# Patient Record
Sex: Male | Born: 1995 | Race: White | Hispanic: No | Marital: Married | Smoking: Current every day smoker
Health system: Southern US, Community
[De-identification: ages and names within clinical notes are randomized; demographics above are authoritative.]

## PROBLEM LIST (undated history)

## (undated) ENCOUNTER — Emergency Department (HOSPITAL_BASED_OUTPATIENT_CLINIC_OR_DEPARTMENT_OTHER): Admission: EM | Payer: Self-pay | Source: Home / Self Care

## (undated) DIAGNOSIS — T7840XA Allergy, unspecified, initial encounter: Secondary | ICD-10-CM

## (undated) DIAGNOSIS — K219 Gastro-esophageal reflux disease without esophagitis: Secondary | ICD-10-CM

## (undated) DIAGNOSIS — K30 Functional dyspepsia: Secondary | ICD-10-CM

## (undated) HISTORY — DX: Allergy, unspecified, initial encounter: T78.40XA

## (undated) HISTORY — DX: Functional dyspepsia: K30

## (undated) HISTORY — PX: NO PAST SURGERIES: SHX2092

---
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
## 2019-11-13 DIAGNOSIS — J029 Acute pharyngitis, unspecified: Secondary | ICD-10-CM | POA: Diagnosis not present

## 2021-08-21 DIAGNOSIS — R1013 Epigastric pain: Secondary | ICD-10-CM | POA: Diagnosis not present

## 2021-08-27 DIAGNOSIS — R1010 Upper abdominal pain, unspecified: Secondary | ICD-10-CM | POA: Diagnosis not present

## 2021-08-28 ENCOUNTER — Other Ambulatory Visit: Payer: Self-pay | Admitting: Family Medicine

## 2021-08-28 DIAGNOSIS — R1010 Upper abdominal pain, unspecified: Secondary | ICD-10-CM

## 2021-09-01 ENCOUNTER — Inpatient Hospital Stay: Admission: RE | Admit: 2021-09-01 | Payer: 59 | Source: Ambulatory Visit

## 2021-09-03 ENCOUNTER — Ambulatory Visit
Admission: RE | Admit: 2021-09-03 | Discharge: 2021-09-03 | Disposition: A | Discharge status: Home / Self Care | Payer: Self-pay | Source: Ambulatory Visit | Attending: Family Medicine | Admitting: Family Medicine

## 2021-09-03 ENCOUNTER — Other Ambulatory Visit: Payer: Self-pay

## 2021-09-03 DIAGNOSIS — R1010 Upper abdominal pain, unspecified: Secondary | ICD-10-CM | POA: Diagnosis not present

## 2021-09-28 ENCOUNTER — Other Ambulatory Visit (HOSPITAL_BASED_OUTPATIENT_CLINIC_OR_DEPARTMENT_OTHER): Payer: Self-pay

## 2021-09-28 ENCOUNTER — Emergency Department (HOSPITAL_BASED_OUTPATIENT_CLINIC_OR_DEPARTMENT_OTHER): Payer: BC Managed Care – PPO

## 2021-09-28 ENCOUNTER — Other Ambulatory Visit: Payer: Self-pay

## 2021-09-28 ENCOUNTER — Encounter (HOSPITAL_BASED_OUTPATIENT_CLINIC_OR_DEPARTMENT_OTHER): Payer: Self-pay | Admitting: *Deleted

## 2021-09-28 ENCOUNTER — Emergency Department (HOSPITAL_BASED_OUTPATIENT_CLINIC_OR_DEPARTMENT_OTHER)
Admission: EM | Admit: 2021-09-28 | Discharge: 2021-09-28 | Disposition: A | Discharge status: Home / Self Care | Payer: BC Managed Care – PPO | Attending: Emergency Medicine | Admitting: Emergency Medicine

## 2021-09-28 DIAGNOSIS — R7401 Elevation of levels of liver transaminase levels: Secondary | ICD-10-CM | POA: Insufficient documentation

## 2021-09-28 DIAGNOSIS — R109 Unspecified abdominal pain: Secondary | ICD-10-CM | POA: Diagnosis not present

## 2021-09-28 DIAGNOSIS — R110 Nausea: Secondary | ICD-10-CM | POA: Insufficient documentation

## 2021-09-28 DIAGNOSIS — R112 Nausea with vomiting, unspecified: Secondary | ICD-10-CM | POA: Diagnosis not present

## 2021-09-28 DIAGNOSIS — R1011 Right upper quadrant pain: Secondary | ICD-10-CM

## 2021-09-28 DIAGNOSIS — D72829 Elevated white blood cell count, unspecified: Secondary | ICD-10-CM | POA: Insufficient documentation

## 2021-09-28 DIAGNOSIS — R1013 Epigastric pain: Secondary | ICD-10-CM | POA: Diagnosis not present

## 2021-09-28 HISTORY — DX: Gastro-esophageal reflux disease without esophagitis: K21.9

## 2021-09-28 LAB — COMPREHENSIVE METABOLIC PANEL
ALT: 122 U/L — ABNORMAL HIGH (ref 0–44)
AST: 85 U/L — ABNORMAL HIGH (ref 15–41)
Albumin: 5 g/dL (ref 3.5–5.0)
Alkaline Phosphatase: 56 U/L (ref 38–126)
Anion gap: 15 (ref 5–15)
BUN: 16 mg/dL (ref 6–20)
CO2: 19 mmol/L — ABNORMAL LOW (ref 22–32)
Calcium: 9.7 mg/dL (ref 8.9–10.3)
Chloride: 103 mmol/L (ref 98–111)
Creatinine, Ser: 0.89 mg/dL (ref 0.61–1.24)
GFR, Estimated: >60 mL/min (ref >60)
Glucose, Bld: 115 mg/dL — ABNORMAL HIGH (ref 70–99)
Potassium: 3.8 mmol/L (ref 3.5–5.1)
Sodium: 137 mmol/L (ref 135–145)
Total Bilirubin: 1.6 mg/dL — ABNORMAL HIGH (ref 0.3–1.2)
Total Protein: 8.3 g/dL — ABNORMAL HIGH (ref 6.5–8.1)

## 2021-09-28 LAB — URINALYSIS, ROUTINE W REFLEX MICROSCOPIC
Bilirubin Urine: NEGATIVE
Glucose, UA: NEGATIVE mg/dL
Hgb urine dipstick: NEGATIVE
Ketones, ur: >=80 mg/dL — AB
Leukocytes,Ua: NEGATIVE
Nitrite: NEGATIVE
Protein, ur: NEGATIVE mg/dL
Specific Gravity, Urine: 1.025 (ref 1.005–1.030)
pH: 5.5 (ref 5.0–8.0)

## 2021-09-28 LAB — CBC
HCT: 52.8 % — ABNORMAL HIGH (ref 39.0–52.0)
Hemoglobin: 17.8 g/dL — ABNORMAL HIGH (ref 13.0–17.0)
MCH: 29.6 pg (ref 26.0–34.0)
MCHC: 33.7 g/dL (ref 30.0–36.0)
MCV: 87.9 fL (ref 80.0–100.0)
Platelets: 263 10*3/uL (ref 150–400)
RBC: 6.01 MIL/uL — ABNORMAL HIGH (ref 4.22–5.81)
RDW: 13.6 % (ref 11.5–15.5)
WBC: 16.6 10*3/uL — ABNORMAL HIGH (ref 4.0–10.5)
nRBC: 0 % (ref 0.0–0.2)

## 2021-09-28 LAB — LIPASE, BLOOD: Lipase: 35 U/L (ref 11–51)

## 2021-09-28 MED ORDER — TRAMADOL HCL 50 MG PO TABS
50.0000 mg | ORAL_TABLET | Freq: Three times a day (TID) | ORAL | 0 refills | Status: DC | PRN
  Filled 2021-09-28: qty 10, 4d supply, fill #0

## 2021-09-28 MED ORDER — FENTANYL CITRATE PF 50 MCG/ML IJ SOSY
50.0000 ug | PREFILLED_SYRINGE | Freq: Once | INTRAMUSCULAR | Status: AC
  Administered 2021-09-28: 50 ug via INTRAVENOUS
  Filled 2021-09-28: qty 1

## 2021-09-28 MED ORDER — LACTATED RINGERS IV BOLUS
1000.0000 mL | Freq: Once | INTRAVENOUS | Status: AC
  Administered 2021-09-28: 1000 mL via INTRAVENOUS

## 2021-09-28 MED ORDER — ESOMEPRAZOLE MAGNESIUM 40 MG PO CPDR
40.0000 mg | DELAYED_RELEASE_CAPSULE | Freq: Every day | ORAL | 0 refills | Status: DC
  Filled 2021-09-28: qty 30, 30d supply, fill #0

## 2021-09-28 MED ORDER — ONDANSETRON HCL 4 MG/2ML IJ SOLN
4.0000 mg | Freq: Once | INTRAMUSCULAR | Status: AC
  Administered 2021-09-28: 4 mg via INTRAVENOUS
  Filled 2021-09-28: qty 2

## 2021-09-28 NOTE — ED Notes (Signed)
States he has had at lead 12-16 loose stools since last PM. Having normal flatus ?

## 2021-09-28 NOTE — ED Notes (Signed)
Presents with epigastric type pain, states intermittent, can be very severe at times, often will need to vomit. States there seems to be no pattern to his pain, Abd is soft, bowel sounds x 4 easily noted, dullness on percussion. Non-distended at this time. Murphy sign negative and negative pain at RLQ during palpation. ?

## 2021-09-28 NOTE — ED Triage Notes (Signed)
Abdominal pain in January. He had a normal [place]. He was started on Nexium with relief. Last night he had diarrhea and abdominal pain. He took Valium with no relief. Hyperventilating at triage.  ?

## 2021-09-28 NOTE — Discharge Instructions (Addendum)
You have been seen and discharged from the emergency department.  An ambulatory referral to GI has been placed.  You require further gastroenterology work-up, possible endoscopy and further testing on the gallbladder.  They should contact you to set up follow-up, if you do not hear from them from next week please reach out to their office at the provided information.  I have increased her Nexium dose and sent a prescription.  I have also sent to prescription for pain medicine as needed.  Follow a bland diet till symptoms improve.  Follow-up with your primary provider for further evaluation and further care. Take home medications as prescribed. If you have any worsening symptoms or further concerns for your health please return to an emergency department for further evaluation. ?

## 2021-09-28 NOTE — ED Provider Notes (Signed)
Patient signed out to me by previous provider. Please refer to their note for full HPI.  Briefly this is a 26 year old male who presents emergency department epigastric pain.  Mild elevation in LFTs and stable slightly elevated bilirubin at 1.6.  Plan for ultrasound for evaluation, symptomatic treatment. ?Physical Exam  ?BP (!) 132/57   Pulse 79   Temp 97.7 ?F (36.5 ?C) (Oral)   Resp 17   Ht 5\' 11"  (1.803 m)   Wt 74.8 kg   SpO2 99%   BMI 23.01 kg/m?  ? ?Physical Exam ?Vitals and nursing note reviewed.  ?Constitutional:   ?   General: He is not in acute distress. ?   Appearance: Normal appearance.  ?HENT:  ?   Head: Normocephalic.  ?   Mouth/Throat:  ?   Mouth: Mucous membranes are moist.  ?Cardiovascular:  ?   Rate and Rhythm: Normal rate.  ?Pulmonary:  ?   Effort: Pulmonary effort is normal. No respiratory distress.  ?Abdominal:  ?   General: There is no distension.  ?   Palpations: Abdomen is soft.  ?   Tenderness: There is abdominal tenderness. There is no guarding or rebound.  ?Skin: ?   General: Skin is warm.  ?Neurological:  ?   Mental Status: He is alert and oriented to person, place, and time. Mental status is at baseline.  ?Psychiatric:     ?   Mood and Affect: Mood normal.  ? ? ?Procedures  ?Procedures ? ?ED Course / MDM  ?  ?Medical Decision Making ?Amount and/or Complexity of Data Reviewed ?Labs: ordered. ?Radiology: ordered. ? ?Risk ?Prescription drug management. ? ? ?Ultrasound shows no acute abnormalities.  Patient has been trying a low-dose Nexium for the past couple weeks.  Could be anything from gastritis to gallbladder dysfunction.  Plan for ambulatory GI referral, symptomatic treatment with strict return to ED precautions.  Abdominal exam for me is benign, vital signs are normal.  Patient at this time appears safe and stable for discharge and close outpatient follow up. Discharge plan and strict return to ED precautions discussed, patient verbalizes understanding and agreement. ? ? ? ? ?   ? , DO ?09/28/21 1626 ? ?

## 2021-09-28 NOTE — ED Provider Notes (Signed)
?[facility] EMERGENCY DEPARTMENT ?Provider Note ? ? ?CSN: [id number] ?Arrival date & time: 09/28/21  1233 ? ?  ? ?History ? ?No chief complaint on file. ? ? ?Jorge Hampton is a 26 y.o. male.  Presenting to the ER with concern for epigastric abdominal pain.  Patient has been having episodes over the past couple months.  Has discussed with primary care doctor, has had outpatient ultrasound which was reportedly normal and states that her primary doctor was working on referring to gastroenterology.  Has not seen specialist yet.  Most recent pain episode started last night, had loose stool, no blood.  Then had abdominal cramping, upper abdominal pain.  States that he tried taking Valium without any improvement.  Some associated nausea.  No fevers or chills.  Pain isolated to his upper abdomen.  No pain in lower abdomen. ? ?Completed chart review, ultrasound on 2/9 of right upper quadrant was completely normal. ?HPI ? ?  ? ?Home Medications ?Prior to Admission medications   ?Medication Sig Start Date End Date Taking? Authorizing Provider  ?diazepam (VALIUM) 2 MG tablet 1 tablet as needed 08/21/21  Yes [provider]  ?esomeprazole (NEXIUM) 40 MG capsule Take 1 capsule (40 mg total) by mouth daily. 09/28/21  Yes Horton, Clabe Seal, DO  ?traMADol (ULTRAM) 50 MG tablet Take 1 tablet (50 mg total) by mouth every 8 (eight) hours as needed. 09/28/21  Yes Horton, Clabe Seal, DO  ?albuterol (PROVENTIL HFA;VENTOLIN HFA) 108 (90 Base) MCG/ACT inhaler Inhale 2 puffs into the lungs every 4 (four) hours as needed for wheezing or shortness of breath. 12/13/15   Hayden Rasmussen, NP  ?ondansetron (ZOFRAN ODT) 8 MG disintegrating tablet Take 1 tablet (8 mg total) by mouth every 8 (eight) hours as needed for nausea or vomiting. 07/26/15   Molpus, John, MD  ?ondansetron (ZOFRAN) 4 MG tablet Take 1 tablet (4 mg total) by mouth every 6 (six) hours. Prn nausea or vomiting 12/13/15   Hayden Rasmussen, NP  ?predniSONE (DELTASONE) 20 MG tablet  Take 3 tabs po on first day, 2 tabs second day, 2 tabs third day, 1 tab fourth day, 1 tab 5th day. Take with food. 12/13/15   Hayden Rasmussen, NP  ?   ? ?Allergies    ?Patient has no known allergies.   ? ?Review of Systems   ?Review of Systems  ?Constitutional:  Negative for chills and fever.  ?HENT:  Negative for ear pain and sore throat.   ?Eyes:  Negative for pain and visual disturbance.  ?Respiratory:  Negative for cough and shortness of breath.   ?Cardiovascular:  Negative for chest pain and palpitations.  ?Gastrointestinal:  Positive for abdominal pain. Negative for vomiting.  ?Genitourinary:  Negative for dysuria and hematuria.  ?Musculoskeletal:  Negative for arthralgias and back pain.  ?Skin:  Negative for color change and rash.  ?Neurological:  Negative for seizures and syncope.  ?All other systems reviewed and are negative. ? ?Physical Exam ?Updated Vital Signs ?BP 136/75 (BP Location: Right Arm)   Pulse 91   Temp 98.1 ?F (36.7 ?C) (Oral)   Resp 17   Ht 5\' 11"  (1.803 m)   Wt 74.8 kg   SpO2 100%   BMI 23.01 kg/m?  ?Physical Exam ?Vitals and nursing note reviewed.  ?Constitutional:   ?   General: He is not in acute distress. ?   Appearance: He is well-developed.  ?HENT:  ?   Head: Normocephalic and atraumatic.  ?Eyes:  ?  Conjunctiva/sclera: Conjunctivae normal.  ?Cardiovascular:  ?   Rate and Rhythm: Normal rate and regular rhythm.  ?   Heart sounds: No murmur heard. ?Pulmonary:  ?   Effort: Pulmonary effort is normal. No respiratory distress.  ?   Breath sounds: Normal breath sounds.  ?Abdominal:  ?   Palpations: Abdomen is soft.  ?   Tenderness: There is no abdominal tenderness.  ?   Comments: There is some tenderness to the epigastrium and right upper quadrant, no lower abdominal tenderness noted  ?Musculoskeletal:     ?   General: No swelling.  ?   Cervical back: Neck supple.  ?Skin: ?   General: Skin is warm and dry.  ?   Capillary Refill: Capillary refill takes less than 2 seconds.  ?Neurological:   ?   Mental Status: He is alert.  ?Psychiatric:     ?   Mood and Affect: Mood normal.  ? ? ?ED Results / Procedures / Treatments   ?Labs ?(all labs ordered are listed, but only abnormal results are displayed) ?Labs Reviewed  ?COMPREHENSIVE METABOLIC PANEL - Abnormal; Notable for the following components:  ?    Result Value  ? CO2 19 (*)   ? Glucose, Bld 115 (*)   ? Total Protein 8.3 (*)   ? AST 85 (*)   ? ALT 122 (*)   ? Total Bilirubin 1.6 (*)   ? All other components within normal limits  ?CBC - Abnormal; Notable for the following components:  ? WBC 16.6 (*)   ? RBC 6.01 (*)   ? Hemoglobin 17.8 (*)   ? HCT 52.8 (*)   ? All other components within normal limits  ?URINALYSIS, ROUTINE W REFLEX MICROSCOPIC - Abnormal; Notable for the following components:  ? Ketones, ur >=80 (*)   ? All other components within normal limits  ?LIPASE, BLOOD  ? ? ?EKG ?None ? ?Radiology ?US Abdomen Limited RUQ (LIVER/GB) ? ?Result Date: 09/28/2021 ?CLINICAL DATA:  Mid abdominal pain, nausea, and vomiting. EXAM: ULTRASOUND ABDOMEN LIMITED RIGHT UPPER QUADRANT COMPARISON:  Abdominal ultrasound 09/03/2021 FINDINGS: Gallbladder: No gallstones or wall thickening visualized. No sonographic Murphy sign noted by sonographer. Common bile duct: Diameter: 3 mm Liver: No focal lesion identified. Within normal limits in parenchymal echogenicity. Portal vein is patent on color Doppler imaging with normal direction of blood flow towards the liver. Other: None. IMPRESSION: Negative right upper quadrant ultrasound. Electronically Signed   By: Sebastian Ache M.D.   On: 09/28/2021 15:33   ? ?Procedures ?Procedures  ? ? ?Medications Ordered in ED ?Medications  ?fentaNYL (SUBLIMAZE) injection 50 mcg (50 mcg Intravenous Given 09/28/21 1353)  ?ondansetron (ZOFRAN) injection 4 mg (4 mg Intravenous Given 09/28/21 1353)  ?lactated ringers bolus 1,000 mL (0 mLs Intravenous Stopped 09/28/21 1452)  ? ? ?ED Course/ Medical Decision Making/ A&P ?  ?                         ?Medical Decision Making ?Amount and/or Complexity of Data Reviewed ?Labs: ordered. ?Radiology: ordered. ? ?Risk ?Prescription drug management. ? ? ?26 year old male presenting to the ER with concern for abdominal discomfort.  On physical exam he is well-appearing in no distress, noted some mild tenderness to the epigastrium and right upper quadrant but no rebound or guarding and no tenderness in his lower quadrants.  Initial laboratory work-up obtained and noted slight elevation in AST and ALT as well as leukocytosis.  Per review of chart,  patient had similar level of leukocytosis 5 years ago.  Given the patient occasion of pain, we will plan to repeat ultrasound in ER to evaluate for acute biliary pathology.  While awaiting ultrasound and reassessment, signed out to Dr. Wilkie Aye.  She will follow-up on results of ultrasound and reassess patient.  If the ultrasound is reassuring and patient's symptoms are well controlled, then anticipate he will be able to follow-up with his primary care doctor and continue work-up in an outpatient setting for these intermittent episodes that he has been having over the past couple months. ? ?Mother at bedside who provided some additional history and was updated throughout patient's stay. ? ?Additional history obtained from review of chart, review of past ultrasound results, past laboratory values. ? ? ? ? ? ? ? ?Final Clinical Impression(s) / ED Diagnoses ?Final diagnoses:  ?RUQ pain  ? ? ?Rx / DC Orders ?ED Discharge Orders   ? ?      Ordered  ?  esomeprazole (NEXIUM) 40 MG capsule  Daily       ? 09/28/21 1623  ?  traMADol (ULTRAM) 50 MG tablet  Every 8 hours PRN       ? 09/28/21 1623  ?  Ambulatory referral to Gastroenterology       ? 09/28/21 1623  ? ?  ?  ? ?  ? ? ?  ?Milagros Loll, MD ?09/29/21 [phone] ? ?

## 2021-09-29 ENCOUNTER — Encounter: Payer: Self-pay | Admitting: Gastroenterology

## 2021-10-13 ENCOUNTER — Encounter: Payer: Self-pay | Admitting: Gastroenterology

## 2021-10-13 ENCOUNTER — Other Ambulatory Visit (INDEPENDENT_AMBULATORY_CARE_PROVIDER_SITE_OTHER): Payer: BC Managed Care – PPO

## 2021-10-13 ENCOUNTER — Other Ambulatory Visit: Payer: Self-pay

## 2021-10-13 ENCOUNTER — Ambulatory Visit (INDEPENDENT_AMBULATORY_CARE_PROVIDER_SITE_OTHER): Payer: BC Managed Care – PPO | Admitting: Gastroenterology

## 2021-10-13 VITALS — BP 134/62 | HR 58 | Ht 72.0 in | Wt 165.1 lb

## 2021-10-13 DIAGNOSIS — D72829 Elevated white blood cell count, unspecified: Secondary | ICD-10-CM

## 2021-10-13 DIAGNOSIS — R748 Abnormal levels of other serum enzymes: Secondary | ICD-10-CM

## 2021-10-13 DIAGNOSIS — R1013 Epigastric pain: Secondary | ICD-10-CM

## 2021-10-13 DIAGNOSIS — R17 Unspecified jaundice: Secondary | ICD-10-CM | POA: Diagnosis not present

## 2021-10-13 DIAGNOSIS — R1010 Upper abdominal pain, unspecified: Secondary | ICD-10-CM

## 2021-10-13 LAB — CBC WITH DIFFERENTIAL/PLATELET
Basophils Absolute: 0.1 10*3/uL (ref 0.0–0.1)
Basophils Relative: 0.8 % (ref 0.0–3.0)
Eosinophils Absolute: 0.1 10*3/uL (ref 0.0–0.7)
Eosinophils Relative: 1.1 % (ref 0.0–5.0)
HCT: 44.7 % (ref 39.0–52.0)
Hemoglobin: 14.8 g/dL (ref 13.0–17.0)
Lymphocytes Relative: 23.1 % (ref 12.0–46.0)
Lymphs Abs: 1.5 10*3/uL (ref 0.7–4.0)
MCHC: 33.2 g/dL (ref 30.0–36.0)
MCV: 89 fl (ref 78.0–100.0)
Monocytes Absolute: 0.5 10*3/uL (ref 0.1–1.0)
Monocytes Relative: 7.8 % (ref 3.0–12.0)
Neutro Abs: 4.3 10*3/uL (ref 1.4–7.7)
Neutrophils Relative %: 67.2 % (ref 43.0–77.0)
Platelets: 207 10*3/uL (ref 150.0–400.0)
RBC: 5.02 Mil/uL (ref 4.22–5.81)
RDW: 13.8 % (ref 11.5–15.5)
WBC: 6.4 10*3/uL (ref 4.0–10.5)

## 2021-10-13 LAB — HEPATIC FUNCTION PANEL
ALT: 30 U/L (ref 0–53)
AST: 19 U/L (ref 0–37)
Albumin: 4.8 g/dL (ref 3.5–5.2)
Alkaline Phosphatase: 52 U/L (ref 39–117)
Bilirubin, Direct: 0.1 mg/dL (ref 0.0–0.3)
Total Bilirubin: 0.6 mg/dL (ref 0.2–1.2)
Total Protein: 7 g/dL (ref 6.0–8.3)

## 2021-10-13 NOTE — Progress Notes (Signed)
? ? ?          ?Chief Complaint: Abdominal pain, elevated liver enzymes, ER follow-up ? ? ?Referring Provider:     Lorelle Gibbs, DO ? ? ?HPI:   ? ? 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 ? ?He reports having intermittent upper abdominal pain for the last 6 years or so. Sxs occur in flares a few times/year. The most recent in Jan and earlier this month were the worst episodes to date. Starts as generalized, cramping abdominal pain, then becomes sharp, "15 out of 10" pain in MEG. No radiation. Throbbing type pain.  ? ?-07/26/2015: CT A/P: Apparent thickening of jejunal folds concerning for enteritis.  Otherwise normal ?- 12/14/2015: WBC 16.6, otherwise normal CBC.  T. bili 1.6, otherwise normal CMP ?- 07/2021: Started taking OTC Nexium 20 mg/day ?- 09/03/2021: Abdominal ultrasound: Normal ? ?-09/28/2021: ER evaluation for epigastric pain.  HD stable.  Exam with some TTP in MEG and RUQ. ?- WBC 16.6, H/H17.8/52.8, PLT 263 ?- AST/ALT 85/122, T. bili 1.6, ALP 56 ?- CO2 19, otherwise normal BMP.  Normal lipase ?- RUQ [place]: Normal ?- Discharged with Nexium 40 mg/day ? ?He states he has been told his liver enzymes have been elevated in the past by his firehouse physician. No hx of jaundice , icteric sclera, ascites.  ? ?No known family history of CRC, GI malignancy, liver disease, pancreatic disease, or IBD.  ? ?No prior EGD or colonoscopy.  ? ? ?Past Medical History:  ?Diagnosis Date  ? GERD (gastroesophageal reflux disease)   ? ? ? ?History reviewed. No pertinent surgical history. ?Family History  ?Problem Relation Age of Onset  ? Colon cancer Neg Hx   ? Esophageal cancer Neg Hx   ? ?Social History  ? ?Tobacco Use  ? Smoking status: Some Days  ?  Types: Cigarettes  ? Smokeless tobacco: Current  ?  Types: Snuff, Chew  ? Tobacco comments:  ?  Socially smoke  ?Vaping Use  ? Vaping Use: Some days  ?Substance Use Topics  ? Alcohol use: Yes  ?  Comment:  ocassionally  ? Drug use: No  ? ?Current Outpatient Medications  ?Medication Sig Dispense Refill  ? esomeprazole (NEXIUM) 40 MG capsule Take 1 capsule (40 mg total) by mouth daily. 30 capsule 0  ? loratadine (CLARITIN) 10 MG tablet Take 10 mg by mouth daily as needed for allergies.    ? traMADol (ULTRAM) 50 MG tablet Take 1 tablet (50 mg total) by mouth every 8 (eight) hours as needed. 10 tablet 0  ? albuterol (PROVENTIL HFA;VENTOLIN HFA) 108 (90 Base) MCG/ACT inhaler Inhale 2 puffs into the lungs every 4 (four) hours as needed for wheezing or shortness of breath. (Patient not taking: Reported on 10/13/2021) 1 Inhaler 0  ? diazepam (VALIUM) 2 MG tablet 1 tablet as needed (Patient not taking: Reported on 10/13/2021)    ? ?No current facility-administered medications for this visit.  ? ?No Known Allergies ? ? ?Review of Systems: ?All systems reviewed and negative except where noted in HPI.  ? ? ? ?Physical Exam:   ? ?Wt Readings from Last 3 Encounters:  ?10/13/21 165 lb 2 oz (74.9 kg)  ?09/28/21 165 lb (74.8 kg)  ?12/14/15 150 lb (68 kg) (50 %, Z= 0.00)*  ? ?* Growth percentiles are based on CDC (Boys, 2-20 Years) data.  ? ? ?BP 134/62   Pulse (!) 58  Ht 6' (1.829 m)   Wt 165 lb 2 oz (74.9 kg)   BMI 22.39 kg/m?  ?Constitutional:  Pleasant, in no acute distress. ?Psychiatric: Normal mood and affect. Behavior is normal. ?Cardiovascular: Normal rate, regular rhythm. No edema ?Pulmonary/chest: Effort normal and breath sounds normal. No wheezing, rales or rhonchi. ?Abdominal: Soft, nondistended, nontender. Bowel sounds active throughout. There are no masses palpable. No hepatomegaly. ?Neurological: Alert and oriented to person place and time. ?Skin: Skin is warm and dry. No rashes noted. ? ? ?ASSESSMENT AND PLAN;  ? ?1) Upper abdominal pain/Epigastric pain ?- EGD to evaluate for mucosal/luminal pathology ?- Okay to resume OTC Nexium for now ?- HIDA scan to evaluate for biliary dyskinesia ?- If above work-up unrevealing,  plan for CT (vs CTA) ? ?2) Elevated AST/ALT ?3) Elevated T. bili ?- Repeat liver enzymes with T. bili fractionization ?- HIDA as above ?- Recent RUQ [place] otherwise normal ? ?4) Leukocytosis ?- Repeat CBC.  If WBC still elevated, plan for Hematology referral ? ?The indications, risks, and benefits of EGD were explained to the patient in detail. Risks include but are not limited to bleeding, perforation, adverse reaction to medications, and cardiopulmonary compromise. Sequelae include but are not limited to the possibility of surgery, hospitalization, and mortality. The patient verbalized understanding and wished to proceed. All questions answered, referred to scheduler. Further recommendations pending results of the exam.  ? ? ?Lavena Bullion, DO, FACG  10/13/2021, 9:21 AM ? ? ?Hampton, Jorge Critchley, DO ? ?

## 2021-10-13 NOTE — Patient Instructions (Addendum)
If you are age 25 or younger, your body mass index should be between 19-25. Your Body mass index is 22.39 kg/m?Marland Kitchen If this is out of the aformentioned range listed, please consider follow up with your Primary Care Provider.  ? ?__________________________________________________________ ? ?The [facility] providers would like to encourage you to use [facility] to communicate with providers for non-urgent requests or questions.  Due to long hold times on the telephone, sending your provider a message by [facility] may be a faster and more efficient way to get a response.  Please allow 48 business hours for a response.  Please remember that this is for non-urgent requests.   ? ?Due to recent changes in healthcare laws, you may see the results of your imaging and laboratory studies on MyChart before your provider has had a chance to review them.  We understand that in some cases there may be results that are confusing or concerning to you. Not all laboratory results come back in the same time frame and the provider may be waiting for multiple results in order to interpret others.  Please give [place] 48 hours in order for your provider to thoroughly review all the results before contacting the office for clarification of your results.  ? ?Please go to the lab on the 2nd floor [address] before you leave the office today.  ? ?You have been scheduled for an endoscopy. Please follow written instructions given to you at your visit today. ?If you use inhalers (even only as needed), please bring them with you on the day of your procedure.  ? ?You have been scheduled for a HIDA scan at [facility] (1st floor) on 10/30/21 Please arrive 15 minutes prior to your scheduled appointment at  0.23XI. Make certain not to have anything to eat or drink at least 6 hours prior to your test. Should this appointment date or time not work well for you, please call radiology scheduling at [phone].   ?_____________________________________________________________________ ?hepatobiliary (HIDA) scan is an imaging procedure used to diagnose problems in the liver, gallbladder and bile ducts. In the HIDA scan, a radioactive chemical or tracer is injected into a vein in your arm. The tracer is handled by the liver like bile. Bile is a fluid produced and excreted by your liver that helps your digestive system break down fats in the foods you eat. Bile is stored in your gallbladder and the gallbladder releases the bile when you eat a meal. A special nuclear medicine scanner (gamma camera) tracks the flow of the tracer from your liver into your gallbladder and small intestine.  ?During your HIDA scan  ?You'll be asked to change into a hospital gown before your HIDA scan begins. Your health care team will position you on a table, usually on your back. The radioactive tracer is then injected into a vein in your arm.The tracer travels through your bloodstream to your liver, where it's taken up by the bile-producing cells. The radioactive tracer travels with the bile from your liver into your gallbladder and through your bile ducts to your small intestine.You may feel some pressure while the radioactive tracer is injected into your vein. As you lie on the table, a special gamma camera is positioned over your abdomen taking pictures of the tracer as it moves through your body. The gamma camera takes pictures continually for about an hour. You'll need to keep still during the HIDA scan. This can become uncomfortable, but you may find that you can lessen the  discomfort by taking deep breaths and thinking about other things. Tell your health care team if you're uncomfortable. The radiologist will watch on a computer the progress of the radioactive tracer through your body. The HIDA scan may be stopped when the radioactive tracer is seen in the gallbladder and enters your small intestine. This typically takes about an hour. In  some cases extra imaging will be performed if original images aren't satisfactory, if morphine is given to help visualize the gallbladder or if the medication CCK is given to look at the contraction of the gallbladder. This test typically takes 2 hours to complete. ?________________________________________________________________________   ? ? ? ? ?Thank you for choosing me and [facility]. ? ?Doristine Locks, D.O. ? ? ? ? ?We want to thank you for trusting [facility] with your care. All of our staff and providers value the relationships we have built with our patients, and it is an honor to care for you.  ? ?We are writing to let you know that [facility] will close on Dec 07, 2021, and we invite you to continue to see Dr. Edman Circle and Doristine Locks at the [facility] [place] office location. We are consolidating our serices at these [facility] practices to better provide care. Our office staff will work with you to ensure a seamless transition.  ? ?Doristine Locks, DO -Dr. Barron Alvine will be movig to [facility] at [address] [place], [place] [address], effective Dec 07, 2021.  Contact [phone] to schedule an appointment with him.  ? ?Edman Circle, MD- Dr. Chales Abrahams will be movig to [facility] at [address] [place], [place] [address], effective Dec 07, 2021.  Contact [phone] to schedule an appointment with him.  ? ?Requesting Medical Records ?If you need to request your medical records, please follow the instructions below. Your medical records are confidential, and a copy can be transferred to another provider or released to you or another person you designate only with your permission. ? ?There are several ways to request your medical records: ?Requests for medical records can be submitted through our practice.   ?You can also request your records electronically, in your MyChart account by selecting  the ?Request Health Records? tab.  ?If you need additional information on how to request records, please go to [email or web address], choose Patient Information, then select Request Medical Records. ?To make an appointment or if you have any questions about your health care needs, please contact our office at [phone] and one of our staff members will be glad to assist you. ?[facility] is committed to providing exceptional care for you and our community. Thank you for allowing [place] to serve your health care needs. ?Sincerely, ? ?Trixie Dredge, Director  Gastroenterology ?[facility] also offers convenient virtual care options. Sore throat? Sinus problems? Cold or flu symptoms? Get care from the comfort of home with [facility] Video Visits and e-Visits. Learn more about the non-emergency conditions treated and start your virtual visit at [email or web address] ? ?

## 2021-10-14 ENCOUNTER — Telehealth: Payer: Self-pay | Admitting: General Surgery

## 2021-10-14 NOTE — Telephone Encounter (Signed)
Notified the patient his labs have returned to normal and Dr Bryan Lemma would like him to proceed with the EGD and HIDA scan. The patient verbalized understanding ?

## 2021-10-14 NOTE — Telephone Encounter (Signed)
-----   Message from Shellia Cleverly, DO sent at 10/14/2021  7:56 AM EDT ----- ?Good news- the CBC is normal, including normalization of the elevated WBC. The liver enzymes have also normalized. Plan to proceed with EGD and HIDA as planned.  ?

## 2021-10-14 NOTE — Telephone Encounter (Signed)
-----   Message from Vito Cirigliano V, DO sent at 10/14/2021  7:56 AM EDT ----- ?Good news- the CBC is normal, including normalization of the elevated WBC. The liver enzymes have also normalized. Plan to proceed with EGD and HIDA as planned.  ?

## 2021-10-23 ENCOUNTER — Encounter: Payer: Self-pay | Admitting: Gastroenterology

## 2021-10-23 ENCOUNTER — Ambulatory Visit (AMBULATORY_SURGERY_CENTER): Payer: BC Managed Care – PPO | Admitting: Gastroenterology

## 2021-10-23 VITALS — BP 96/55 | HR 66 | Temp 97.8°F | Resp 12 | Ht 72.0 in | Wt 165.0 lb

## 2021-10-23 DIAGNOSIS — K219 Gastro-esophageal reflux disease without esophagitis: Secondary | ICD-10-CM | POA: Diagnosis not present

## 2021-10-23 DIAGNOSIS — K2970 Gastritis, unspecified, without bleeding: Secondary | ICD-10-CM | POA: Diagnosis not present

## 2021-10-23 DIAGNOSIS — R1013 Epigastric pain: Secondary | ICD-10-CM

## 2021-10-23 DIAGNOSIS — R1010 Upper abdominal pain, unspecified: Secondary | ICD-10-CM | POA: Diagnosis not present

## 2021-10-23 MED ORDER — SODIUM CHLORIDE 0.9 % IV SOLN: 500.0000 mL | Freq: Once | INTRAVENOUS | Status: DC

## 2021-10-23 NOTE — Progress Notes (Signed)
Called to room to assist during endoscopic procedure.  Patient ID and intended procedure confirmed with present staff. Received instructions for my participation in the procedure from the performing physician.  

## 2021-10-23 NOTE — Op Note (Signed)
[place] ?Patient Name: Jorge Hampton ?Procedure Date: 10/23/2021 10:17 AM ?MRN: [id number] ?Endoscopist: Gerrit Heck , MD ?Age: 26 ?Referring MD:  ?Date of Birth: 05-May-1996 ?Gender: Male ?Account #: [id number] ?Procedure:                Upper GI endoscopy ?Indications:              Epigastric abdominal pain, Upper abdominal pain ?Medicines:                Monitored Anesthesia Care ?Procedure:                Pre-Anesthesia Assessment: ?                          - Prior to the procedure, a History and Physical  ?                          was performed, and patient medications and  ?                          allergies were reviewed. The patient's tolerance of  ?                          previous anesthesia was also reviewed. The risks  ?                          and benefits of the procedure and the sedation  ?                          options and risks were discussed with the patient.  ?                          All questions were answered, and informed consent  ?                          was obtained. Prior Anticoagulants: The patient has  ?                          taken no previous anticoagulant or antiplatelet  ?                          agents. ASA Grade Assessment: II - A patient with  ?                          mild systemic disease. After reviewing the risks  ?                          and benefits, the patient was deemed in  ?                          satisfactory condition to undergo the procedure. ?                          After obtaining informed consent, the endoscope was  ?  passed under direct vision. Throughout the  ?                          procedure, the patient's blood pressure, pulse, and  ?                          oxygen saturations were monitored continuously. The  ?                          GIF [id number] #[id number] was introduced through the  ?                          mouth, and advanced to the second part of duodenum.  ?                          The  upper GI endoscopy was accomplished without  ?                          difficulty. The patient tolerated the procedure  ?                          well. ?Scope In: ?Scope Out: ?Findings:                 The examined esophagus was normal. ?                          The Z-line was regular and was found 44 cm from the  ?                          incisors. ?                          Scattered mild inflammation characterized by  ?                          erythema was found in the gastric fundus and in the  ?                          gastric body. Biopsies were taken with a cold  ?                          forceps for histology. Estimated blood loss was  ?                          minimal. ?                          The incisura, gastric antrum and pylorus were  ?                          normal. ?                          The examined duodenum was normal. ?Complications:            No  immediate complications. ?Estimated Blood Loss:     Estimated blood loss was minimal. ?Impression:               - Normal esophagus. ?                          - Z-line regular, 44 cm from the incisors. ?                          - Gastritis. Biopsied. ?                          - Normal incisura, antrum and pylorus. ?                          - Normal examined duodenum. ?Recommendation:           - Patient has a contact number available for  ?                          emergencies. The signs and symptoms of potential  ?                          delayed complications were discussed with the  ?                          patient. Return to normal activities tomorrow.  ?                          Written discharge instructions were provided to the  ?                          patient. ?                          - Resume previous diet. ?                          - Continue present medications. ?                          - Await pathology results. ?Gerrit Heck, MD ?10/23/2021 10:45:33 AM ?

## 2021-10-23 NOTE — Patient Instructions (Signed)
YOU HAD AN ENDOSCOPIC PROCEDURE TODAY AT THE [facility]:   Refer to the procedure report that was given to you for any specific questions about what was found during the examination.  If the procedure report does not answer your questions, please call your gastroenterologist to clarify.  If you requested that your care partner not be given the details of your procedure findings, then the procedure report has been included in a sealed envelope for you to review at your convenience later.  YOU SHOULD EXPECT: Some feelings of bloating in the abdomen. Passage of more gas than usual.  Walking can help get rid of the air that was put into your GI tract during the procedure and reduce the bloating. If you had a lower endoscopy (such as a colonoscopy or flexible sigmoidoscopy) you may notice spotting of blood in your stool or on the toilet paper. If you underwent a bowel prep for your procedure, you may not have a normal bowel movement for a few days.  Please Note:  You might notice some irritation and congestion in your nose or some drainage.  This is from the oxygen used during your procedure.  There is no need for concern and it should clear up in a day or so.  SYMPTOMS TO REPORT IMMEDIATELY:    Following upper endoscopy (EGD)  Vomiting of blood or coffee ground material  New chest pain or pain under the shoulder blades  Painful or persistently difficult swallowing  New shortness of breath  Fever of 100F or higher  Black, tarry-looking stools  For urgent or emergent issues, a gastroenterologist can be reached at any hour by calling [phone]. Do not use MyChart messaging for urgent concerns.    DIET:  We do recommend a small meal at first, but then you may proceed to your regular diet.  Drink plenty of fluids but you should avoid alcoholic beverages for 24 hours.  ACTIVITY:  You should plan to take it easy for the rest of today and you should NOT DRIVE or use heavy machinery  until tomorrow (because of the sedation medicines used during the test).    FOLLOW UP: Our staff will call the number listed on your records 48-72 hours following your procedure to check on you and address any questions or concerns that you may have regarding the information given to you following your procedure. If we do not reach you, we will leave a message.  We will attempt to reach you two times.  During this call, we will ask if you have developed any symptoms of COVID 19. If you develop any symptoms (ie: fever, flu-like symptoms, shortness of breath, cough etc.) before then, please call [phone].  If you test positive for Covid 19 in the 2 weeks post procedure, please call and report this information to us.    If any biopsies were taken you will be contacted by phone or by letter within the next 1-3 weeks.  Please call us at [phone] if you have not heard about the biopsies in 3 weeks.    SIGNATURES/CONFIDENTIALITY: You and/or your care partner have signed paperwork which will be entered into your electronic medical record.  These signatures attest to the fact that that the information above on your After Visit Summary has been reviewed and is understood.  Full responsibility of the confidentiality of this discharge information lies with you and/or your care-partner. 

## 2021-10-23 NOTE — Progress Notes (Signed)
? ?GASTROENTEROLOGY PROCEDURE H&P NOTE  ? ?Primary Care Physician: ?Johny Blamer, MD ? ? ? ?Reason for Procedure:   Epigastric pain/upper abdominal pain, leukocytosis ? ?Plan:    EGD ? ?Patient is appropriate for endoscopic procedure(s) in the ambulatory ([facility]) setting. ? ?The nature of the procedure, as well as the risks, benefits, and alternatives were carefully and thoroughly reviewed with the patient. Ample time for discussion and questions allowed. The patient understood, was satisfied, and agreed to proceed.  ? ? ? ?HPI: ?Jorge Hampton is a 26 y.o. male who presents for EGD for evaluation of recurrent upper abdominal/epigastric pain.  ? ?Past Medical History:  ?Diagnosis Date  ? Allergy   ? GERD (gastroesophageal reflux disease)   ? ? ?History reviewed. No pertinent surgical history. ? ?Prior to Admission medications   ?Medication Sig Start Date End Date Taking? Authorizing Provider  ?esomeprazole (NEXIUM) 40 MG capsule Take 1 capsule (40 mg total) by mouth daily. 09/28/21  Yes Horton, Clabe Seal, DO  ?loratadine (CLARITIN) 10 MG tablet Take 10 mg by mouth daily as needed for allergies.   Yes [provider]  ?acetaminophen (TYLENOL) 500 MG tablet 1 tablet as needed ?Patient not taking: Reported on 10/23/2021    [provider]  ?diazepam (VALIUM) 2 MG tablet 1 tablet as needed ?Patient not taking: Reported on 10/13/2021 08/21/21   [provider]  ?dicyclomine (BENTYL) 20 MG tablet 1 tablet ?Patient not taking: Reported on 10/23/2021 08/21/21   [provider]  ?traMADol (ULTRAM) 50 MG tablet Take 1 tablet (50 mg total) by mouth every 8 (eight) hours as needed. ?Patient not taking: Reported on 10/23/2021 09/28/21   Horton, Clabe Seal, DO  ? ? ?Current Outpatient Medications  ?Medication Sig Dispense Refill  ? esomeprazole (NEXIUM) 40 MG capsule Take 1 capsule (40 mg total) by mouth daily. 30 capsule 0  ? loratadine (CLARITIN) 10 MG tablet Take 10 mg by mouth daily as needed for  allergies.    ? acetaminophen (TYLENOL) 500 MG tablet 1 tablet as needed (Patient not taking: Reported on 10/23/2021)    ? diazepam (VALIUM) 2 MG tablet 1 tablet as needed (Patient not taking: Reported on 10/13/2021)    ? dicyclomine (BENTYL) 20 MG tablet 1 tablet (Patient not taking: Reported on 10/23/2021)    ? traMADol (ULTRAM) 50 MG tablet Take 1 tablet (50 mg total) by mouth every 8 (eight) hours as needed. (Patient not taking: Reported on 10/23/2021) 10 tablet 0  ? ?Current Facility-Administered Medications  ?Medication Dose Route Frequency Provider Last Rate Last Admin  ? 0.9 %  sodium chloride infusion  500 mL Intravenous Once Maxima Skelton V, DO      ? ? ?Allergies as of 10/23/2021  ? (No Known Allergies)  ? ? ?Family History  ?Problem Relation Age of Onset  ? Colon cancer Neg Hx   ? Esophageal cancer Neg Hx   ? Rectal cancer Neg Hx   ? Stomach cancer Neg Hx   ? ? ?Social History  ? ?Socioeconomic History  ? Marital status: Single  ?  Spouse name: Not on file  ? Number of children: Not on file  ? Years of education: Not on file  ? Highest education level: Not on file  ?Occupational History  ? Not on file  ?Tobacco Use  ? Smoking status: Some Days  ?  Types: Cigarettes  ? Smokeless tobacco: Current  ?  Types: Snuff, Chew  ? Tobacco comments:  ?  Socially  smoke  ?Vaping Use  ? Vaping Use: Some days  ?Substance and Sexual Activity  ? Alcohol use: Yes  ?  Comment: ocassionally  ? Drug use: No  ? Sexual activity: Not on file  ?Other Topics Concern  ? Not on file  ?Social History Narrative  ? Not on file  ? ?Social Determinants of Health  ? ?Financial Resource Strain: Not on file  ?Food Insecurity: Not on file  ?Transportation Needs: Not on file  ?Physical Activity: Not on file  ?Stress: Not on file  ?Social Connections: Not on file  ?Intimate Partner Violence: Not on file  ? ? ?Physical Exam: ?Vital signs in last 24 hours: ?@BP  122/73   Pulse (!) 52   Temp 97.8 ?F (36.6 ?C)   Resp (!) 9   Ht 6' (1.829 m)    Wt 165 lb (74.8 kg)   SpO2 100%   BMI 22.38 kg/m?  ?GEN: NAD ?EYE: Sclerae anicteric ?ENT: MMM ?CV: Non-tachycardic ?Pulm: CTA b/l ?GI: Soft, NT/ND ?NEURO:  Alert & Oriented x 3 ? ? ? , DO ?[facility] ? ? ?10/23/2021 10:27 AM ? ?

## 2021-10-23 NOTE — Progress Notes (Signed)
Report to PACU, RN, vss, BBS= Clear.  

## 2021-10-27 ENCOUNTER — Telehealth: Payer: Self-pay

## 2021-10-27 NOTE — Telephone Encounter (Signed)
?  Follow up Call- ? ? ?  10/23/2021  ?  9:49 AM  ?Call back number  ?Post procedure Call Back phone  # [phone]  ?Permission to leave phone message Yes  ?  ? ?Patient questions: ? ?Do you have a fever, pain , or abdominal swelling? No. ?Pain Score  0 * ? ?Have you tolerated food without any problems? Yes.   ? ?Have you been able to return to your normal activities? Yes.   ? ?Do you have any questions about your discharge instructions: ?Diet   No. ?Medications  No. ?Follow up visit  No. ? ?Do you have questions or concerns about your Care? No. ? ?Actions: ?* If pain score is 4 or above: ?No action needed, pain <4. ? ? ?

## 2021-10-27 NOTE — Telephone Encounter (Signed)
No answer on follow up call. Voicemail full.  

## 2021-10-30 ENCOUNTER — Ambulatory Visit (HOSPITAL_COMMUNITY)
Admission: RE | Admit: 2021-10-30 | Discharge: 2021-10-30 | Disposition: A | Discharge status: Home / Self Care | Payer: BC Managed Care – PPO | Source: Ambulatory Visit | Attending: Gastroenterology | Admitting: Gastroenterology

## 2021-10-30 DIAGNOSIS — R748 Abnormal levels of other serum enzymes: Secondary | ICD-10-CM | POA: Diagnosis not present

## 2021-10-30 DIAGNOSIS — D72829 Elevated white blood cell count, unspecified: Secondary | ICD-10-CM | POA: Diagnosis not present

## 2021-10-30 DIAGNOSIS — R17 Unspecified jaundice: Secondary | ICD-10-CM | POA: Insufficient documentation

## 2021-10-30 DIAGNOSIS — R1013 Epigastric pain: Secondary | ICD-10-CM | POA: Insufficient documentation

## 2021-10-30 DIAGNOSIS — R7989 Other specified abnormal findings of blood chemistry: Secondary | ICD-10-CM | POA: Diagnosis not present

## 2021-10-30 MED ORDER — TECHNETIUM TC 99M MEBROFENIN IV KIT
5.2700 | PACK | Freq: Once | INTRAVENOUS | Status: AC | PRN
  Administered 2021-10-30: 5.27 via INTRAVENOUS

## 2021-11-02 ENCOUNTER — Telehealth: Payer: Self-pay | Admitting: Gastroenterology

## 2021-11-02 NOTE — Telephone Encounter (Signed)
Inbound call from patient requesting results from procedure done on 3/31. Please advise.  ?

## 2021-11-02 NOTE — Telephone Encounter (Signed)
Spoke with pt. Pt wanted to know results from EGD on 3/31 and HIDA scan done on 10/30/21. Let pt know that Dr. Barron Alvine has not reviewed the results yet. Also let pt know that Dr. Barron Alvine would return back to the office on Thursday and he would be contacted with results once Dr. Barron Alvine returns.  ?

## 2021-11-05 ENCOUNTER — Encounter: Payer: Self-pay | Admitting: Gastroenterology

## 2021-11-05 NOTE — Telephone Encounter (Signed)
Gastric biopsies were normal without any evidence of active inflammation or Helicobacter pylori infection. ? ?HIDA scan was also normal with gallbladder ejection fraction 51% (normal is greater than 33%).  No evidence of biliary dyskinesia. ? ?Trial course of FD guard for nonulcer dyspepsia.  If ongoing symptoms, plan for CT abdomen/pelvis with contrast to evaluate for extraluminal or vascular etiology for symptoms. ?

## 2021-11-05 NOTE — Telephone Encounter (Signed)
Spoke with pt and gave pt results and recommendations. Pt verbalized understanding and had no other questions at end of call. ?

## 2021-12-17 ENCOUNTER — Telehealth: Payer: Self-pay | Admitting: Gastroenterology

## 2021-12-17 NOTE — Telephone Encounter (Signed)
Spoke with pt and he stated that he has had 2 severe episodes of abd pain within the last 2 weeks. When he experiences the abd pain he takes tylenol which helps some but pt states that he always has abd discomfort. Pt notices pain usually gets worse after a meal. Pain is located in epigastric region first then moves throughout entire abdomen. Pt states that currently he is experiencing abd discomfort and wanted to know Dr. Vivia Ewing recommendations.

## 2021-12-17 NOTE — Telephone Encounter (Signed)
Patient called  requesting to speak with a nurse regarding having two more flares up and having chronic abdominal pain.

## 2021-12-18 ENCOUNTER — Other Ambulatory Visit: Payer: Self-pay

## 2021-12-18 DIAGNOSIS — R109 Unspecified abdominal pain: Secondary | ICD-10-CM

## 2021-12-18 DIAGNOSIS — R1013 Epigastric pain: Secondary | ICD-10-CM

## 2021-12-18 NOTE — Telephone Encounter (Signed)
Please set up for CT abdomen/pelvis with PO and IV contrast to evaluate abdominal pain further. Want to eval for luminal pathology along with possibly vascular pathology (ie, median arcuate ligament syndrome, SMA syndrome, etc). Depending on results, can discuss role/utility of additional dedicated arteriograaphy (ie, CTA, MRA).

## 2021-12-18 NOTE — Telephone Encounter (Signed)
Spoke with the patient and advised of the recommendations. Patient agrees to this plan of care. He will expect a call from the Radiology Scheduling to set this CT up. Message/request to Radiology Scheduling.

## 2021-12-21 ENCOUNTER — Emergency Department (HOSPITAL_BASED_OUTPATIENT_CLINIC_OR_DEPARTMENT_OTHER)
Admission: EM | Admit: 2021-12-21 | Discharge: 2021-12-21 | Disposition: A | Discharge status: Home / Self Care | Payer: BC Managed Care – PPO | Attending: Emergency Medicine | Admitting: Emergency Medicine

## 2021-12-21 ENCOUNTER — Emergency Department (HOSPITAL_BASED_OUTPATIENT_CLINIC_OR_DEPARTMENT_OTHER): Payer: BC Managed Care – PPO

## 2021-12-21 ENCOUNTER — Other Ambulatory Visit: Payer: Self-pay

## 2021-12-21 ENCOUNTER — Encounter (HOSPITAL_BASED_OUTPATIENT_CLINIC_OR_DEPARTMENT_OTHER): Payer: Self-pay | Admitting: Emergency Medicine

## 2021-12-21 DIAGNOSIS — R1013 Epigastric pain: Secondary | ICD-10-CM | POA: Insufficient documentation

## 2021-12-21 DIAGNOSIS — R109 Unspecified abdominal pain: Secondary | ICD-10-CM | POA: Diagnosis not present

## 2021-12-21 LAB — URINALYSIS, ROUTINE W REFLEX MICROSCOPIC
Bilirubin Urine: NEGATIVE
Glucose, UA: NEGATIVE mg/dL
Hgb urine dipstick: NEGATIVE
Ketones, ur: NEGATIVE mg/dL
Leukocytes,Ua: NEGATIVE
Nitrite: NEGATIVE
Protein, ur: NEGATIVE mg/dL
Specific Gravity, Urine: 1.015 (ref 1.005–1.030)
pH: 6 (ref 5.0–8.0)

## 2021-12-21 LAB — COMPREHENSIVE METABOLIC PANEL
ALT: 20 U/L (ref 0–44)
AST: 19 U/L (ref 15–41)
Albumin: 4.7 g/dL (ref 3.5–5.0)
Alkaline Phosphatase: 51 U/L (ref 38–126)
Anion gap: 9 (ref 5–15)
BUN: 14 mg/dL (ref 6–20)
CO2: 24 mmol/L (ref 22–32)
Calcium: 9.3 mg/dL (ref 8.9–10.3)
Chloride: 105 mmol/L (ref 98–111)
Creatinine, Ser: 1.02 mg/dL (ref 0.61–1.24)
GFR, Estimated: >60 mL/min (ref >60)
Glucose, Bld: 91 mg/dL (ref 70–99)
Potassium: 4.2 mmol/L (ref 3.5–5.1)
Sodium: 138 mmol/L (ref 135–145)
Total Bilirubin: 1.3 mg/dL — ABNORMAL HIGH (ref 0.3–1.2)
Total Protein: 7.6 g/dL (ref 6.5–8.1)

## 2021-12-21 LAB — CBC WITH DIFFERENTIAL/PLATELET
Abs Immature Granulocytes: 0.03 10*3/uL (ref 0.00–0.07)
Basophils Absolute: 0.1 10*3/uL (ref 0.0–0.1)
Basophils Relative: 1 %
Eosinophils Absolute: 0.1 10*3/uL (ref 0.0–0.5)
Eosinophils Relative: 1 %
HCT: 48.1 % (ref 39.0–52.0)
Hemoglobin: 16.2 g/dL (ref 13.0–17.0)
Immature Granulocytes: 0 %
Lymphocytes Relative: 19 %
Lymphs Abs: 1.7 10*3/uL (ref 0.7–4.0)
MCH: 29.7 pg (ref 26.0–34.0)
MCHC: 33.7 g/dL (ref 30.0–36.0)
MCV: 88.3 fL (ref 80.0–100.0)
Monocytes Absolute: 0.5 10*3/uL (ref 0.1–1.0)
Monocytes Relative: 6 %
Neutro Abs: 6.4 10*3/uL (ref 1.7–7.7)
Neutrophils Relative %: 73 %
Platelets: 243 10*3/uL (ref 150–400)
RBC: 5.45 MIL/uL (ref 4.22–5.81)
RDW: 13.1 % (ref 11.5–15.5)
WBC: 8.7 10*3/uL (ref 4.0–10.5)
nRBC: 0 % (ref 0.0–0.2)

## 2021-12-21 LAB — LIPASE, BLOOD: Lipase: 37 U/L (ref 11–51)

## 2021-12-21 MED ORDER — SODIUM CHLORIDE 0.9 % IV BOLUS
1000.0000 mL | Freq: Once | INTRAVENOUS | Status: AC
  Administered 2021-12-21: 1000 mL via INTRAVENOUS

## 2021-12-21 MED ORDER — IOHEXOL 300 MG/ML  SOLN
100.0000 mL | Freq: Once | INTRAMUSCULAR | Status: AC | PRN
  Administered 2021-12-21: 100 mL via INTRAVENOUS

## 2021-12-21 MED ORDER — MORPHINE SULFATE (PF) 4 MG/ML IV SOLN
4.0000 mg | Freq: Once | INTRAVENOUS | Status: AC
  Administered 2021-12-21: 4 mg via INTRAVENOUS
  Filled 2021-12-21: qty 1

## 2021-12-21 MED ORDER — ONDANSETRON HCL 4 MG/2ML IJ SOLN
4.0000 mg | Freq: Once | INTRAMUSCULAR | Status: AC
  Administered 2021-12-21: 4 mg via INTRAVENOUS
  Filled 2021-12-21: qty 2

## 2021-12-21 NOTE — ED Notes (Signed)
ED Provider at bedside. 

## 2021-12-21 NOTE — Discharge Instructions (Signed)
Try to avoid things that may make this worse, most commonly these are spicy foods tomato based products fatty foods chocolate and peppermint.  Alcohol and tobacco can also make this worse.  Return to the emergency department for sudden worsening pain fever or inability to eat or drink.  

## 2021-12-21 NOTE — ED Triage Notes (Addendum)
Severe pain stomach pain since Sat . Has GI MD

## 2021-12-21 NOTE — ED Provider Notes (Signed)
[facility] EMERGENCY DEPARTMENT Provider Note   CSN: [id number] Arrival date & time: 12/21/21  1243     History  Chief Complaint  Patient presents with   Abdominal Pain    Jorge Hampton is a 26 y.o. male.  26 yo M with a chief complaint of abdominal discomfort.  This been going on since at least January.  Pain seems to be worse to the epigastrium but sometimes is diffusely about the abdomen.  He has been following along with GI for this and had a recent endoscopy and a nuclear medicine gallbladder test.  Both were unremarkable and in their notes they had planned for a CT scan.  He had worsening pain going on for the past couple days.  No nausea no vomiting no fevers.  Pain is significantly worse with eating.  Improves with Tylenol and Valium.   Abdominal Pain     Home Medications Prior to Admission medications   Medication Sig Start Date End Date Taking? Authorizing Provider  acetaminophen (TYLENOL) 500 MG tablet 1 tablet as needed Patient not taking: Reported on 10/23/2021    [provider]  diazepam (VALIUM) 2 MG tablet 1 tablet as needed Patient not taking: Reported on 10/13/2021 08/21/21   [provider]  dicyclomine (BENTYL) 20 MG tablet 1 tablet Patient not taking: Reported on 10/23/2021 08/21/21   [provider]  esomeprazole (NEXIUM) 40 MG capsule Take 1 capsule (40 mg total) by mouth daily. 09/28/21   Horton, Danford BadKristie M, DO  loratadine (CLARITIN) 10 MG tablet Take 10 mg by mouth daily as needed for allergies.    [provider]  traMADol (ULTRAM) 50 MG tablet Take 1 tablet (50 mg total) by mouth every 8 (eight) hours as needed. Patient not taking: Reported on 10/23/2021 09/28/21   Horton, Clabe SealKristie M, DO      Allergies    Patient has no known allergies.    Review of Systems   Review of Systems  Gastrointestinal:  Positive for abdominal pain.   Physical Exam Updated Vital Signs BP 120/66   Pulse (!) 59   Temp 97.9 F  (36.6 C) (Oral)   Resp 16   Ht 5\' 11"  (1.803 m)   Wt 72.2 kg   SpO2 99%   BMI 22.19 kg/m  Physical Exam Vitals and nursing note reviewed.  Constitutional:      Appearance: He is well-developed.  HENT:     Head: Normocephalic and atraumatic.  Eyes:     Pupils: Pupils are equal, round, and reactive to light.  Neck:     Vascular: No JVD.  Cardiovascular:     Rate and Rhythm: Normal rate and regular rhythm.     Heart sounds: No murmur heard.   No friction rub. No gallop.  Pulmonary:     Effort: No respiratory distress.     Breath sounds: No wheezing.  Abdominal:     General: There is no distension.     Tenderness: There is no abdominal tenderness. There is no guarding or rebound.  Musculoskeletal:        General: Normal range of motion.     Cervical back: Normal range of motion and neck supple.  Skin:    Coloration: Skin is not pale.     Findings: No rash.  Neurological:     Mental Status: He is alert and oriented to person, place, and time.  Psychiatric:        Behavior: Behavior normal.  ED Results / Procedures / Treatments   Labs (all labs ordered are listed, but only abnormal results are displayed) Labs Reviewed  COMPREHENSIVE METABOLIC PANEL - Abnormal; Notable for the following components:      Result Value   Total Bilirubin 1.3 (*)    All other components within normal limits  CBC WITH DIFFERENTIAL/PLATELET  LIPASE, BLOOD  URINALYSIS, ROUTINE W REFLEX MICROSCOPIC    EKG None  Radiology CT ABDOMEN PELVIS W CONTRAST  Result Date: 12/21/2021 CLINICAL DATA:  Abdominal pain, acute nonlocalized.  History of GERD EXAM: CT ABDOMEN AND PELVIS WITH CONTRAST TECHNIQUE: Multidetector CT imaging of the abdomen and pelvis was performed using the standard protocol following bolus administration of intravenous contrast. RADIATION DOSE REDUCTION: This exam was performed according to the departmental dose-optimization program which includes automated exposure control,  adjustment of the mA and/or kV according to patient size and/or use of iterative reconstruction technique. CONTRAST:  OMNIPAQUE IOHEXOL 300 MG/ML  SOLN COMPARISON:  CT examination dated July 26, 2015 FINDINGS: Lower chest: No acute abnormality. Hepatobiliary: No focal liver abnormality is seen. No gallstones, gallbladder wall thickening, or biliary dilatation. Pancreas: Unremarkable. No pancreatic ductal dilatation or surrounding inflammatory changes. Spleen: Normal in size without focal abnormality. Adrenals/Urinary Tract: Adrenal glands are unremarkable. Kidneys are normal, without renal calculi, focal lesion, or hydronephrosis. Bladder is unremarkable. Stomach/Bowel: Stomach is within normal limits. No evidence of appendicitis. No evidence of bowel wall thickening, distention, or inflammatory changes. Vascular/Lymphatic: No significant vascular findings are present. No enlarged abdominal or pelvic lymph nodes. Reproductive: Prostate is unremarkable. Other: No abdominal wall hernia or abnormality. No abdominopelvic ascites. Musculoskeletal: No acute or significant osseous findings. IMPRESSION: 1.  No CT evidence of acute abdominal/pelvic process. 2.  Stomach is unremarkable without evidence of wall thickening. 3.  No evidence of cholelithiasis or acute cholecystitis. 4.  No evidence of nephrolithiasis or hydronephrosis. Electronically Signed   By: Larose Hires D.O.   On: 12/21/2021 14:37    Procedures Procedures    Medications Ordered in ED Medications  morphine (PF) 4 MG/ML injection 4 mg (4 mg Intravenous Given 12/21/21 1306)  ondansetron (ZOFRAN) injection 4 mg (4 mg Intravenous Given 12/21/21 1306)  sodium chloride 0.9 % bolus 1,000 mL (1,000 mLs Intravenous New Bag/Given 12/21/21 1305)  iohexol (OMNIPAQUE) 300 MG/ML solution 100 mL (100 mLs Intravenous Contrast Given 12/21/21 1345)    ED Course/ Medical Decision Making/ A&P                           Medical Decision Making Amount  and/or Complexity of Data Reviewed Labs: ordered. Radiology: ordered.  Risk Prescription drug management.   26 yo M with a cc of epigastric abdominal discomfort.  Unfortunately this is a chronic problem for him going on for about 5 months now.  Worsening over the past couple days.  I viewed the patient's charts and he had seen GI and had a negative endoscopy as well as a nuclear medicine gallbladder study that was read as normal.  In their notes they had planned to do CT imaging next to if his pain persisted.  He does have persistent worsening abdominal discomfort.  He has no focal abdominal pain on my exam.  I will obtain a CT scan of the abdomen pelvis with contrast here.  No leukocytosis no anemia UA independently interpreted by me without infection.  LFTs and lipase are unremarkable.  CT scan of the abdomen pelvis without  obvious acute pathology.  I discussed results with patient.  We will have him follow-up with his gastroenterologist.  We will have them give them a call tomorrow.  3:02 PM:  I have discussed the diagnosis/risks/treatment options with the patient and family.  Evaluation and diagnostic testing in the emergency department does not suggest an emergent condition requiring admission or immediate intervention beyond what has been performed at this time.  They will follow up with  GI. We also discussed returning to the ED immediately if new or worsening sx occur. We discussed the sx which are most concerning (e.g., sudden worsening pain, fever, inability to tolerate by mouth) that necessitate immediate return. Medications administered to the patient during their visit and any new prescriptions provided to the patient are listed below.  Medications given during this visit Medications  morphine (PF) 4 MG/ML injection 4 mg (4 mg Intravenous Given 12/21/21 1306)  ondansetron (ZOFRAN) injection 4 mg (4 mg Intravenous Given 12/21/21 1306)  sodium chloride 0.9 % bolus 1,000 mL (1,000 mLs  Intravenous New Bag/Given 12/21/21 1305)  iohexol (OMNIPAQUE) 300 MG/ML solution 100 mL (100 mLs Intravenous Contrast Given 12/21/21 1345)     The patient appears reasonably screen and/or stabilized for discharge and I doubt any other medical condition or other [facility] requiring further screening, evaluation, or treatment in the ED at this time prior to discharge.          Final Clinical Impression(s) / ED Diagnoses Final diagnoses:  Epigastric pain    Rx / DC Orders ED Discharge Orders     None         Melene Plan, DO 12/21/21 1502

## 2021-12-21 NOTE — ED Notes (Signed)
Patient transported to CT 

## 2021-12-22 ENCOUNTER — Other Ambulatory Visit: Payer: BC Managed Care – PPO

## 2021-12-22 ENCOUNTER — Telehealth: Payer: Self-pay | Admitting: Gastroenterology

## 2021-12-22 ENCOUNTER — Other Ambulatory Visit: Payer: Self-pay

## 2021-12-22 DIAGNOSIS — R109 Unspecified abdominal pain: Secondary | ICD-10-CM

## 2021-12-22 DIAGNOSIS — R1013 Epigastric pain: Secondary | ICD-10-CM | POA: Diagnosis not present

## 2021-12-22 NOTE — Telephone Encounter (Signed)
Inbound call from patient stating that he is having abdominal pain. Patient stated he went to the ED yesterday and they did a CT scan. Patient stated he is wanting to speak with the nurse. Please advise.

## 2021-12-22 NOTE — Telephone Encounter (Signed)
Pt's mother called wanting to make sure the office understood that her son is in severe pain and can't work or function. Pt's mother states that he is 72 and this is not normal and someone needs to figure out what's wrong with him. Let pt's mother know that if pt is in severe pain pt would need to go to the ER. Pt's mother stated that it is $500 for the pt to be seen in the ER and he can't just keep going back to the ER and the doctor needs to do something for his pain now. Let pt's mother know I can send doctor another message but if pt needs immediate attention he would need to be seen in the ER. Pt's mother demanded that something be done soon. Let pt's mother know that I can't guarantee that she will get an immediate response and if she needs an immediate response she should take her son to the ER.

## 2021-12-22 NOTE — Telephone Encounter (Signed)
Spoke with to pt and gave pt recommendations. Lab orders placed and pt scheduled for appt with Dr. Barron Alvine tomorrow 5/31 at 10 am. Pt verbalized understanding and stated he thought he would be able to get labs done today.

## 2021-12-22 NOTE — Telephone Encounter (Signed)
Spoke with pt. Pt stated he wanted to let Dr. Barron Alvine know he had CT scan in ED yesterday and also wanted to let doctor know that he has been having severe pain for the last couple days. Pt reports pain is a 9/10 and when pt takes tylenol pain decreases to a 5/10 but then returns once tylenol wears off. Pt stated he was concerned for pancreatitis or celiac disease.

## 2021-12-22 NOTE — Telephone Encounter (Signed)
ER records reviewed.  Normal CBC (including normal WBC 8.7), normal CMP (T. bili 1.3, which has been slightly elevated in the past as well and more suspicious for benign Gilberts), normal lipase and UA.  CT abdomen/pelvis with IV contrast results reviewed and was essentially normal, to include a normal liver, GB, pancreas, spleen, GI tract.  Additionally no evidence of vascular pathology or lymphadenopathy.  He has also been evaluated with upper endoscopy in 09/2021 which was normal, to include normal gastric biopsies.  No endoscopic evidence of Celiac Disease.  Based on ongoing symptoms, I have no issue running a Celiac Panel.  Plan for the following:  - Celiac Panel as above - Spot urine collection for urinary porphobilinogen and total porphyrins to rule out porphyria - Expedited appointment with me in the GI clinic - We will discuss the role/utility of repeat upper endoscopy with duodenal biopsies to rule out Celiac Disease along with eosinophilic enteritis, possible trial of sumatriptan for ?intestinal migraine, vs referral to tertiary academic center

## 2021-12-23 ENCOUNTER — Encounter: Payer: Self-pay | Admitting: Gastroenterology

## 2021-12-23 ENCOUNTER — Ambulatory Visit: Payer: BC Managed Care – PPO | Admitting: Gastroenterology

## 2021-12-23 VITALS — BP 112/68 | HR 68 | Ht 71.0 in | Wt 159.2 lb

## 2021-12-23 DIAGNOSIS — R1013 Epigastric pain: Secondary | ICD-10-CM

## 2021-12-23 DIAGNOSIS — R112 Nausea with vomiting, unspecified: Secondary | ICD-10-CM

## 2021-12-23 MED ORDER — SUMATRIPTAN 20 MG/ACT NA SOLN: 20.0000 mg | NASAL | 1 refills | Status: DC | PRN

## 2021-12-23 NOTE — Progress Notes (Signed)
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  Otherwise normal - 12/14/2015: WBC 16.6, otherwise normal CBC.  T. bili 1.6, otherwise normal CMP - 07/2021: Started taking OTC Nexium 20 mg/day - 09/03/2021: Abdominal ultrasound: Normal - 09/28/2021: ER evaluation for epigastric pain.  HD stable.  WBC 16.6, H/H17.8/52.8, AST/ALT 85/122, T. bili 1.6, ALP 56, normal lipase - 09/28/2021: RUQ [place]: Normal - 10/13/2021: Initial appointment in GI clinic for evaluation of intermittent upper abdominal pain x6 years.  Symptoms occur in flares a few times/year, but increasing in intensity and frequency since 07/2021.  Described as sharp throbbing type epigastric pain without radiation. - 10/13/2021: Normal CBC (WBC 6.4), normal liver enzymes - 10/23/2021: EGD: Mild non-H. pylori gastritis, otherwise normal - 10/30/2021: HIDA normal - 12/21/2021: ER evaluation for epigastric pain.  Normal CBC, CMP (T. bili 1.3), lipase - 12/21/2021: CT A/P with contrast: Normal to include normal vasculature  HPI:     Patient is a 26 y.o. male presenting to the Gastroenterology Clinic for ER follow-up and continued evaluation of abdominal pain.  Was again seen in the ER on 12/13/2021 with epigastric pain.  Exam unremarkable.  Normal labs and normal CT with IV contrast.  Treated with morphine, Zofran, IVF and discharged home.  Currently rates his pain at 1-2/10.  Pain was 10/10 when he was in the ER on 5/29.  He is prescribed Bentyl, but has not taken this in a month or so as this was not efficacious. Most recent episode started immediatley after eating, lasting all night, but can also have episodes independent of p.o. intake.  Does have some improvement with APAP.  Nausea/vomiting when pain becomes intense, but otherwise does not have n/v.  Went for labs yesterday: Celiac panel and  porphyria panel pending  Does have a history of headaches for years, with 1 headache/week.  Not described as typical migraines.   Review of systems:     No chest pain, no SOB, no fevers, no urinary sx   Past Medical History:  Diagnosis Date   Allergy    GERD (gastroesophageal reflux disease)    Non-ulcer dyspepsia     Patient's surgical history, family medical history, social history, medications and allergies were all reviewed in Epic    Current Outpatient Medications  Medication Sig Dispense Refill   acetaminophen (TYLENOL) 500 MG tablet Take 500 mg by mouth as needed.     cetirizine (ZYRTEC) 10 MG tablet Take 20 mg by mouth daily.     diazepam (VALIUM) 2 MG tablet Take 2 mg by mouth as needed.     dicyclomine (BENTYL) 20 MG tablet Take 20 mg by mouth as needed.     esomeprazole (NEXIUM) 20 MG capsule Take 20 mg by mouth every morning.     traMADol (ULTRAM) 50 MG tablet Take 1 tablet (50 mg total) by mouth every 8 (eight) hours as needed. 10 tablet 0   No current facility-administered medications for this visit.    Physical Exam:     BP 112/68   Pulse 68   Ht 5\' 11"  (1.803 m)   Wt 159 lb 4 oz (72.2 kg)   BMI 22.21 kg/m   GENERAL:  Pleasant male in NAD PSYCH: : Cooperative, normal affect EENT:  conjunctiva pink, mucous membranes moist, neck supple without masses CARDIAC:  RRR, no murmur heard,  no peripheral edema PULM: Normal respiratory effort, lungs CTA bilaterally, no wheezing ABDOMEN:  Nondistended, soft, nontender. No obvious masses, no hepatomegaly,  normal bowel sounds SKIN:  turgor, no lesions seen Musculoskeletal:  Normal muscle tone, normal strength NEURO: Alert and oriented x 3, no focal neurologic deficits   IMPRESSION and PLAN:    1) Epigastric pain 2) Nausea/Vomiting  Again reviewed DDx for acute recurrent epigastric pain.  Work-up so far has been largely unrevealing to include EGD, CT, RUQ ultrasound, HIDA, labs.  Discussed possibility of  intestinal migraine and will plan as below:  - Trial of sumatriptan nasal spray 20 mg.  To spray at onset of symptoms.  If no relief can do second dose at 2 hours - Patient to call me after using sumatriptan hand to update on response to therapy - I will discuss with Radiology re: arterial views on most recent CT vs performing CTA to r/o arterial pathology such as MALS, SMA syndrome, etc. - Will follow-up on pending labs - Continue p.o. intake and activity as tolerated   RTC in 2 months or sooner prn         I spent 40 minutes of time, including in depth chart review, independent review of results as outlined above, communicating results with the patient directly, face-to-face time with the patient, coordinating care, ordering studies and medications as appropriate, and documentation.   Shellia Cleverly ,DO, FACG 12/23/2021, 10:38 AM

## 2021-12-23 NOTE — Patient Instructions (Addendum)
If you are age 25 or younger, your body mass index should be between 19-25. Your Body mass index is 22.21 kg/m. If this is out of the aformentioned range listed, please consider follow up with your Primary Care Provider.   ________________________________________________________  The [facility] providers would like to encourage you to use [facility] to communicate with providers for non-urgent requests or questions.  Due to long hold times on the telephone, sending your provider a message by [facility] may be a faster and more efficient way to get a response.  Please allow 48 business hours for a response.  Please remember that this is for non-urgent requests.  _______________________________________________________  Due to recent changes in healthcare laws, you may see the results of your imaging and laboratory studies on MyChart before your provider has had a chance to review them.  We understand that in some cases there may be results that are confusing or concerning to you. Not all laboratory results come back in the same time frame and the provider may be waiting for multiple results in order to interpret others.  Please give [place] 48 hours in order for your provider to thoroughly review all the results before contacting the office for clarification of your results.   We have sent the following medications to your pharmacy for you to pick up at your convenience: Sumatriptan  Please follow up in 2 months. Give [place] a call at [phone] to schedule an appointment.    Thank you for choosing me and [facility].  Vito Cirigliano, D.O.

## 2021-12-24 ENCOUNTER — Telehealth: Payer: Self-pay | Admitting: Gastroenterology

## 2021-12-24 NOTE — Telephone Encounter (Signed)
I reviewed his case with Dr. Elby Showers from IR who also feels that it is possible that he has vascular pathology causing his intermittent, severe abdominal pain.  Curious about SMV pathology (mural thrombus?).  I discussed with the patient by phone today, and he would like to proceed with additional imaging as recommended by the Radiologist.  Please order the following:  - BRTO protocol CTA abdomen/pelvis    Separately, he used the sumatriptan nasal spray last evening for 1-2/10 pain.  Some mild improvement.  I recommend holding off on the sumatriptan until moderate or severe pain, as the mild spasm type pain may not be our target pathology and less likely to be response to therapy.  He understands and agrees.

## 2021-12-25 ENCOUNTER — Telehealth: Payer: Self-pay

## 2021-12-25 ENCOUNTER — Other Ambulatory Visit: Payer: Self-pay

## 2021-12-25 DIAGNOSIS — R1013 Epigastric pain: Secondary | ICD-10-CM

## 2021-12-25 LAB — PORPHOBILINOGEN, RANDOM URINE: Porphobilinogen, Rand Ur: 1.2 mg/L (ref 0.0–2.0)

## 2021-12-25 NOTE — Telephone Encounter (Signed)
Patient made aware.

## 2021-12-25 NOTE — Telephone Encounter (Signed)
Order placed. Secure staff message sent to radiology scheduling.

## 2021-12-25 NOTE — Telephone Encounter (Signed)
-----   Message from Shellia Cleverly, DO sent at 12/24/2021  4:54 PM EDT ----- Celiac panel is negative/normal.

## 2022-01-01 LAB — PORPHYRINS, TOTAL PLASMA: Total porphyrins: 1.4 mcg/L (ref 1.0–5.6)

## 2022-01-01 LAB — TISSUE TRANSGLUTAMINASE ABS,IGG,IGA
(tTG) Ab, IgA: <1 U/mL
(tTG) Ab, IgG: <1 U/mL

## 2022-01-01 LAB — IGA: Immunoglobulin A: 135 mg/dL (ref 47–310)

## 2022-01-05 ENCOUNTER — Ambulatory Visit (HOSPITAL_COMMUNITY)
Admission: RE | Admit: 2022-01-05 | Discharge: 2022-01-05 | Disposition: A | Discharge status: Home / Self Care | Payer: BC Managed Care – PPO | Source: Ambulatory Visit | Attending: Gastroenterology | Admitting: Gastroenterology

## 2022-01-05 DIAGNOSIS — R1013 Epigastric pain: Secondary | ICD-10-CM | POA: Diagnosis not present

## 2022-01-05 MED ORDER — IOHEXOL 350 MG/ML SOLN
100.0000 mL | Freq: Once | INTRAVENOUS | Status: AC | PRN
  Administered 2022-01-05: 100 mL via INTRAVENOUS

## 2022-01-06 ENCOUNTER — Telehealth: Payer: Self-pay

## 2022-01-06 DIAGNOSIS — R109 Unspecified abdominal pain: Secondary | ICD-10-CM

## 2022-01-06 DIAGNOSIS — R1013 Epigastric pain: Secondary | ICD-10-CM

## 2022-01-06 NOTE — Telephone Encounter (Signed)
-----   Message from [place], DO sent at 01/06/2022  3:01 PM EDT ----- CT angiography with BRTO protocol with essentially normal vasculature.  No areas of stenosis, aneurysm, or vasculitis.  Incidentally noted benign focal fatty infiltration of the liver without duct dilation or other concerning findings.  Otherwise normal-appearing pancreas, spleen, GI tract.  No lymphadenopathy.  No vascular or GI causes for chronic, recurrent abdominal pain noted on the study.  Plan to continue medical management as discussed at the time of last appointment.  Can schedule follow-up appointment in GI clinic for 2 months or sooner as needed.

## 2022-01-06 NOTE — Telephone Encounter (Signed)
Called the patient. VM was full. 

## 2022-01-07 NOTE — Telephone Encounter (Signed)
Informed the patient that CT is normal. Patient stated that he is having flare ups more frequently at least once a week with no warning. Stated called out his work due to severe abdominal pain with 12 out of 10 scale. Stated started sumatriptan nasal spray but hasn't noticed the difference. Please advise.

## 2022-01-07 NOTE — Telephone Encounter (Signed)
Informed the patient that CT is normal. Patient stated that he is having flare ups more frequently at least once a week with no warning. Stated called out his work due to severe abdominal pain with 12 out of 10 scale. Stated started sumatriptan nasal spray but hasn't noticed the difference.

## 2022-01-07 NOTE — Telephone Encounter (Signed)
Plan for the following:  - Check ESR, CRP - Start Elavil as a centrally acting pain modulator.  Start with 37.5 mg qhs, then increase to 75 mg qhs after 7 days - Can stop using sumatriptan since it does not sound like that has made a difference for him - If no improvement with Elavil, plan for VCE to rule out any subtle small bowel mucosal pathology - If still no improvement and work-up otherwise continues to be negative, can consider referral to Pain Management and/or referral to tertiary academic center for second opinion if he would like

## 2022-01-08 ENCOUNTER — Other Ambulatory Visit (HOSPITAL_BASED_OUTPATIENT_CLINIC_OR_DEPARTMENT_OTHER): Payer: Self-pay

## 2022-01-08 MED ORDER — AMITRIPTYLINE HCL 75 MG PO TABS
ORAL_TABLET | ORAL | 1 refills | Status: DC
  Filled 2022-01-08: qty 30, 33d supply, fill #0

## 2022-01-08 NOTE — Telephone Encounter (Signed)
Informed the patient Dr. Vivia Ewing plan as below. Lab order is placed, and medication sent to the pharmacy. Patient verbalized understanding.  Plan for the following:   - Check ESR, CRP - Start Elavil as a centrally acting pain modulator.  Start with 37.5 mg qhs, then increase to 75 mg qhs after 7 days - Can stop using sumatriptan since it does not sound like that has made a difference for him - If no improvement with Elavil, plan for VCE to rule out any subtle small bowel mucosal pathology - If still no improvement and work-up otherwise continues to be negative, can consider referral to Pain Management and/or referral to tertiary academic center for second opinion if he would like

## 2022-01-08 NOTE — Addendum Note (Signed)
Addended by: Coletta Memos on: 01/08/2022 01:23 PM   Modules accepted: Orders

## 2022-01-08 NOTE — Telephone Encounter (Signed)
Called the patient. VM was full, couldn't leave a VM.

## 2022-01-09 DIAGNOSIS — R1013 Epigastric pain: Secondary | ICD-10-CM | POA: Diagnosis not present

## 2022-01-09 DIAGNOSIS — R110 Nausea: Secondary | ICD-10-CM | POA: Diagnosis not present

## 2022-01-14 DIAGNOSIS — R1084 Generalized abdominal pain: Secondary | ICD-10-CM | POA: Diagnosis not present

## 2022-01-19 NOTE — Telephone Encounter (Signed)
Spoke with pt and gave pt CT results and Dr. Frankey Shown recommendations regarding VCE. Pt verbalized understanding and stated he would call us back in a few weeks to update [place] on how Elavil is working.

## 2022-01-21 ENCOUNTER — Other Ambulatory Visit (INDEPENDENT_AMBULATORY_CARE_PROVIDER_SITE_OTHER): Payer: BC Managed Care – PPO

## 2022-01-21 DIAGNOSIS — R1013 Epigastric pain: Secondary | ICD-10-CM

## 2022-01-21 LAB — C-REACTIVE PROTEIN: CRP: <1 mg/dL (ref 0.5–20.0)

## 2022-01-21 LAB — SEDIMENTATION RATE: Sed Rate: 6 mm/hr (ref 0–15)

## 2022-01-21 NOTE — Telephone Encounter (Signed)
Pt states he will come in for labs this afternoon or early next week.

## 2022-01-27 ENCOUNTER — Telehealth: Payer: Self-pay

## 2022-01-27 NOTE — Telephone Encounter (Signed)
-----  Message from [place], DO sent at 01/27/2022  8:50 AM EDT ----- ESR and CRP are both normal indicating no active inflammation.  Any improvement since starting the Elavil?

## 2022-01-27 NOTE — Telephone Encounter (Signed)
Called the patient. VM was full.

## 2022-01-28 DIAGNOSIS — R1084 Generalized abdominal pain: Secondary | ICD-10-CM | POA: Diagnosis not present

## 2022-01-28 NOTE — Telephone Encounter (Signed)
Great to hear the sxs have reduced. If needed, we can go up on the Elavil depending on sxs. Thanks for contacting him.

## 2022-01-28 NOTE — Telephone Encounter (Signed)
Patient made aware of his result. Stated he is on day 15 taking Elavil, hasn't had any flare ups for the last 10 days. Stated he is still having mild abdominal pain of 2 to 3 out of 10 but doesn't have to call out for his work. Informed the patient to give [place] an update if his flare ups come back. Patient voiced understanding.

## 2022-02-05 ENCOUNTER — Telehealth: Payer: Self-pay | Admitting: Gastroenterology

## 2022-02-05 MED ORDER — AMITRIPTYLINE HCL 75 MG PO TABS: 75.0000 mg | ORAL_TABLET | Freq: Every day | ORAL | 0 refills | Status: DC

## 2022-02-05 NOTE — Telephone Encounter (Signed)
Thank you for the update.  Agree with sending in refill.  Can provide RF 5.  Thanks

## 2022-02-05 NOTE — Telephone Encounter (Signed)
Pt states he has taken the elavil for a month and his stomach has done ok but he wants a refill because he states his abd sometimes takes a break and will then start bothering him again. Let pt know refill will be sent in and Dr. Barron Alvine will be updated on how he is doing.

## 2022-02-15 DIAGNOSIS — R1013 Epigastric pain: Secondary | ICD-10-CM | POA: Diagnosis not present

## 2022-03-16 ENCOUNTER — Other Ambulatory Visit: Payer: Self-pay

## 2022-03-16 MED ORDER — AMITRIPTYLINE HCL 75 MG PO TABS: 75.0000 mg | ORAL_TABLET | Freq: Every day | ORAL | 5 refills | Status: DC

## 2022-03-16 NOTE — Telephone Encounter (Signed)
Elavil is working as a neuromodulator, essentially blunting nerve pain response in the GI tract using low-dose medication.  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  I am very happy for him that the Elavil is working so well!

## 2022-03-16 NOTE — Telephone Encounter (Signed)
Patient called to follow up on the Elavil medication. Requested a call back today if possible.

## 2022-03-16 NOTE — Telephone Encounter (Signed)
Elavil prescription sent to pt's pharmacy with 5 refills. Pt wanted Dr. Barron Alvine to know that he has not had any abdominal pain since taking the elavil. Pt wanted to know what does it mean that elavil helped his abdominal pain, pt wanted to know what could have been wrong.

## 2022-03-17 NOTE — Telephone Encounter (Signed)
Spoke with pt and gave pt Dr. Cirigliano's message. Pt verbalized understanding and had no other concerns at end of call.  

## 2022-09-05 ENCOUNTER — Other Ambulatory Visit: Payer: Self-pay | Admitting: Gastroenterology

## 2022-09-29 ENCOUNTER — Ambulatory Visit: Payer: Self-pay | Admitting: Gastroenterology

## 2022-09-29 VITALS — BP 140/72 | HR 83 | Ht 71.0 in | Wt 164.8 lb

## 2022-09-29 DIAGNOSIS — R109 Unspecified abdominal pain: Secondary | ICD-10-CM

## 2022-09-29 MED ORDER — AMITRIPTYLINE HCL 50 MG PO TABS: 50.0000 mg | ORAL_TABLET | Freq: Every day | ORAL | 3 refills | Status: DC

## 2022-09-29 NOTE — Patient Instructions (Addendum)
We have sent the following medications to your pharmacy for you to pick up at your convenience: Elavil 50 mg: Take once daily  Please follow up with Dr. Bryan Lemma in 6 months.We will remind you when it is time to schedule an appointment.   It was a pleasure to see you today!  Thank you for trusting me with your gastrointestinal care!      If your blood pressure at your visit was 140/90 or greater, please contact your primary care physician to follow up on this.  _______________________________________________________  If you are age 5 or older, your body mass index should be between 23-30. Your Body mass index is 22.98 kg/m. If this is out of the aforementioned range listed, please consider follow up with your Primary Care Provider.  If you are age 59 or younger, your body mass index should be between 19-25. Your Body mass index is 22.98 kg/m. If this is out of the aformentioned range listed, please consider follow up with your Primary Care Provider.   ________________________________________________________  The [facility] providers would like to encourage you to use [facility] to communicate with providers for non-urgent requests or questions.  Due to long hold times on the telephone, sending your provider a message by [facility] may be a faster and more efficient way to get a response.  Please allow 48 business hours for a response.  Please remember that this is for non-urgent requests.  _______________________________________________________  Due to recent changes in healthcare laws, you may see the results of your imaging and laboratory studies on MyChart before your provider has had a chance to review them.  We understand that in some cases there may be results that are confusing or concerning to you. Not all laboratory results come back in the same time frame and the provider may be waiting for multiple results in order to interpret others.  Please give [place] 48 hours in order for your  provider to thoroughly review all the results before contacting the office for clarification of your results.

## 2022-09-29 NOTE — Progress Notes (Signed)
HPI : Jorge Hampton is a very pleasant 27 year old male known to Dr. 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  The patient experienced marked improvement in his pain shortly after starting the Elavil.   He is tolerating the Elavil well, but has questions about long term treatment and would like to discuss tapering or stopping it, as he just prefers not to have to take medications unless absolutely necessary. He has not had any 'flare ups' of his pain since he started the Elavil.  He does a very mild, lingering burning discomfort which does not bother him significantly.  His appetite is good and he is eating what he wants. He has regular bowel movements, no problems with constipation or diarrhea. He does some mild dizziness typically in the first hour after taking the Elavil, but otherwise denies any side effects.  Prior to January 2023, he admits that he had difficulties with his GI tract, characterized by abdominal discomfort and episodes of diarrhea, but he never had severe pain like what he was experiencing last year.  He has never been diagnosed with anxiety, but he sometimes wonders if this may be an issue for him.    GI History - 07/26/2015: CT A/P: Apparent thickening of jejunal folds concerning for enteritis.  Otherwise normal - 12/14/2015: WBC 16.6, otherwise normal CBC.  T. bili 1.6, otherwise normal CMP - 07/2021: Started taking OTC Nexium 20 mg/day - 09/03/2021: Abdominal ultrasound: Normal - 09/28/2021: ER evaluation for epigastric pain.  HD stable.  WBC 16.6, H/H17.8/52.8, AST/ALT 85/122, T. bili 1.6, ALP 56, normal lipase - 09/28/2021: RUQ [place]: Normal - 10/13/2021: Initial appointment in GI clinic for evaluation of intermittent upper  abdominal pain x6 years.  Symptoms occur in flares a few times/year, but increasing in intensity and frequency since 07/2021.  Described as sharp throbbing type epigastric pain without radiation. - 10/13/2021: Normal CBC (WBC 6.4), normal liver enzymes - 10/23/2021: EGD: Mild non-H. pylori gastritis, otherwise normal - 10/30/2021: HIDA normal - 12/21/2021: ER evaluation for epigastric pain.  Normal CBC, CMP (T. bili 1.3), lipase - 12/21/2021: CT A/P with contrast: Normal to include normal vasculature  Past Medical History:  Diagnosis Date   Allergy    GERD (gastroesophageal reflux disease)    Non-ulcer dyspepsia      No past surgical history on file. Family History  Problem Relation Age of Onset   Colon cancer Neg Hx    Esophageal cancer Neg Hx    Rectal cancer Neg Hx    Stomach cancer Neg Hx    Social History   Tobacco Use   Smoking status: Every Day    Types: E-cigarettes   Smokeless tobacco: Current    Types: Snuff, Chew   Tobacco comments:    Socially smoke  Vaping Use   Vaping Use: Every day  Substance Use Topics   Alcohol use: Yes    Comment: ocassionally   Drug use: No   Current Outpatient Medications  Medication Sig Dispense Refill   amitriptyline (ELAVIL) 75 MG tablet TAKE 1 TABLET BY MOUTH AT BEDTIME. 30 tablet 3   acetaminophen (TYLENOL) 500 MG tablet Take 500 mg by mouth as needed. (Patient not taking: Reported on 09/29/2022)     cetirizine (ZYRTEC) 10 MG tablet  Take 20 mg by mouth daily. (Patient not taking: Reported on 09/29/2022)     diazepam (VALIUM) 2 MG tablet Take 2 mg by mouth as needed. (Patient not taking: Reported on 09/29/2022)     dicyclomine (BENTYL) 20 MG tablet Take 20 mg by mouth as needed. (Patient not taking: Reported on 09/29/2022)     esomeprazole (NEXIUM) 20 MG capsule Take 20 mg by mouth every morning. (Patient not taking: Reported on 09/29/2022)     SUMAtriptan (IMITREX) 20 MG/ACT nasal spray Place 1 spray (20 mg total) into the nose every 2 (two)  hours as needed for migraine or headache. Place 1 spray at onset of  abdominal pain. If no response, place 2 sprays after 2 hours. (Patient not taking: Reported on 09/29/2022) 1 each 1   traMADol (ULTRAM) 50 MG tablet Take 1 tablet (50 mg total) by mouth every 8 (eight) hours as needed. (Patient not taking: Reported on 09/29/2022) 10 tablet 0   No current facility-administered medications for this visit.   No Known Allergies   Review of Systems: All systems reviewed and negative except where noted in HPI.    No results found.  Physical Exam: BP (!) 140/72   Pulse 83   Ht '5\' 11"'$  (1.803 m)   Wt 164 lb 12.8 oz (74.8 kg)   BMI 22.98 kg/m  Constitutional: Pleasant,well-developed, Caucasian male in no acute distress. HEENT: Normocephalic and atraumatic. Conjunctivae are normal. No scleral icterus. Neurological: Alert and oriented to person place and time. Skin: No rashes noted. Psychiatric: Normal mood and affect. Behavior is normal.  CBC    Component Value Date/Time   WBC 8.7 12/21/2021 1300   RBC 5.45 12/21/2021 1300   HGB 16.2 12/21/2021 1300   HCT 48.1 12/21/2021 1300   PLT 243 12/21/2021 1300   MCV 88.3 12/21/2021 1300   MCH 29.7 12/21/2021 1300   MCHC 33.7 12/21/2021 1300   RDW 13.1 12/21/2021 1300   LYMPHSABS 1.7 12/21/2021 1300   MONOABS 0.5 12/21/2021 1300   EOSABS 0.1 12/21/2021 1300   BASOSABS 0.1 12/21/2021 1300    CMP     Component Value Date/Time   NA 138 12/21/2021 1300   K 4.2 12/21/2021 1300   CL 105 12/21/2021 1300   CO2 24 12/21/2021 1300   GLUCOSE 91 12/21/2021 1300   BUN 14 12/21/2021 1300   CREATININE 1.02 12/21/2021 1300   CALCIUM 9.3 12/21/2021 1300   PROT 7.6 12/21/2021 1300   ALBUMIN 4.7 12/21/2021 1300   AST 19 12/21/2021 1300   ALT 20 12/21/2021 1300   ALKPHOS 51 12/21/2021 1300   BILITOT 1.3 (H) 12/21/2021 1300   GFRNONAA >60 12/21/2021 1300   GFRAA >60 12/14/2015 0300     ASSESSMENT AND PLAN: 27 year old male with several month  history of severe episodic epigastric pain from January to June 2023 with extensive work up negative for an underlying etiology, eventually started on Elavil with significant clinical improvement.  He has not had any severe episodes of pain in about 7-8 months.   We discussed the nature of gut-brain axis disorders and the relationship between stress, anxiety and the GI tract.  He recalls that he was moving into a new house around the time when his pain started and wonders if this may have been his trigger.  We discussed how there is no set time course of TCA treatment for IBS-like disorders, but given how his symptoms seemed to have been acutely clustered in that time period,  and possibly triggered by a stressful life event, it seems reasonable to wean down his Elavil dose and consider stopping sometime in the future. Will plan to decrease Elavil to 50 mg PO daily, and then have the patient follow up with Dr. Melanee Left in 6 months to consider further tapering.  Abdominal pain - Decrease Elavil to 50 mg PO daily - Follow up in 6 months with Dr. Bryan Lemma to consider further taper  Kandas Oliveto E. Candis Schatz, MD [facility]  CC:  Shirline Frees, MD

## 2023-02-01 ENCOUNTER — Encounter: Payer: Self-pay | Admitting: Gastroenterology

## 2023-09-17 ENCOUNTER — Other Ambulatory Visit: Payer: Self-pay | Admitting: Gastroenterology

## 2023-09-19 ENCOUNTER — Telehealth: Payer: Self-pay | Admitting: Gastroenterology

## 2023-09-19 NOTE — Telephone Encounter (Signed)
 Returned patients call and line is busy

## 2023-09-19 NOTE — Telephone Encounter (Signed)
 Patient called requesting to speak with someone about the Elavil medication.

## 2023-09-20 MED ORDER — AMITRIPTYLINE HCL 50 MG PO TABS: 50.0000 mg | ORAL_TABLET | Freq: Every day | ORAL | 0 refills | Status: DC

## 2023-09-20 NOTE — Telephone Encounter (Signed)
 Patient called regarding note below. States he did not receive a call. Phone number is now update. Please advise, thank you.

## 2023-09-20 NOTE — Telephone Encounter (Signed)
 Patient states he has been unable to refill his amitriptyline at the pharmacy. Informed patient we have not received a refill request. Informed patient I will send in a refill to his pharmacy but he needs to schedule a f/u visit to see Dr. Tomasa Rand. Patient states he wanted to schedule an appt anyways to discuss changing his medication dosage. Scheduled patient to see Dr. Tomasa Rand on 11/24/23 at 8:30 am.

## 2023-11-24 ENCOUNTER — Encounter: Payer: Self-pay | Admitting: Gastroenterology

## 2023-11-24 ENCOUNTER — Ambulatory Visit: Payer: Self-pay | Admitting: Gastroenterology

## 2023-11-24 VITALS — BP 110/70 | HR 84 | Ht 72.0 in | Wt 184.0 lb

## 2023-11-24 DIAGNOSIS — Z720 Tobacco use: Secondary | ICD-10-CM

## 2023-11-24 DIAGNOSIS — R109 Unspecified abdominal pain: Secondary | ICD-10-CM

## 2023-11-24 MED ORDER — AMITRIPTYLINE HCL 25 MG PO TABS: 25.0000 mg | ORAL_TABLET | Freq: Every day | ORAL | 1 refills | Status: DC

## 2023-11-24 NOTE — Patient Instructions (Signed)
 We have sent the following medications to your pharmacy for you to pick up at your convenience: Elavil  25 mg daily .  Please send a message in mychart letting us  know how you are doing with the new dosage.   _______________________________________________________  If your blood pressure at your visit was 140/90 or greater, please contact your primary care physician to follow up on this.  _______________________________________________________  If you are age 27 or older, your body mass index should be between 23-30. Your Body mass index is 24.95 kg/m. If this is out of the aforementioned range listed, please consider follow up with your Primary Care Provider.  If you are age 58 or younger, your body mass index should be between 19-25. Your Body mass index is 24.95 kg/m. If this is out of the aformentioned range listed, please consider follow up with your Primary Care Provider.   ________________________________________________________  The [facility] providers would like to encourage you to use MYCHART to communicate with providers for non-urgent requests or questions.  Due to long hold times on the telephone, sending your provider a message by [facility] may be a faster and more efficient way to get a response.  Please allow 48 business hours for a response.  Please remember that this is for non-urgent requests.  _______________________________________________________   It was a pleasure to see you today!  Thank you for trusting me with your gastrointestinal care!    Scott E.Cherryl Corona, MD

## 2023-11-24 NOTE — Progress Notes (Signed)
 HPI : Jorge Hampton is a 28 year old male who presents today for follow-up of functional abdominal pain and discuss Amitriptyline . He has had extensive negative workup for history of severe episodic epigastric pain (see GI history below).  He was last seen in the office 09/29/2022, at which time he was doing well on Elavil . Stress does appear to trigger his abdominal pain. Plan was to decrease to 50mg  PO daily, then discuss further tapering at follow-up.  Today, patient states he is doing very well on Elavil  50mg  and has not had any episodes of pain since his last visit a year ago. He had previously been on 75mg  and noticed side effect of blurry vision on this dose, but has had no side effects on 50mg . He would like to try and taper down the dose further.  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  He has a bowel movement daily and denies diarrhea, constipation, bloody stools or melena. Denies nausea/vomiting or difficulty swallowing.   He has recently started having heartburn and acid reflux which he attributes to switching to a new brand of chewing tobacco. He does not have symptoms after eating.  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  GI History: - 07/26/2015: CT A/P: Apparent thickening of jejunal folds concerning for enteritis.  Otherwise normal - 12/14/2015: WBC 16.6, otherwise normal CBC.  T. bili 1.6, otherwise normal CMP - 07/2021: Started taking OTC Nexium  20 mg/day - 09/03/2021: Abdominal ultrasound: Normal - 09/28/2021: ER evaluation for epigastric pain.  HD stable.  WBC 16.6, H/H17.8/52.8, AST/ALT 85/122, T. bili 1.6, ALP 56, normal lipase - 09/28/2021: RUQ US : Normal - 10/13/2021: Initial appointment in GI clinic for evaluation of intermittent upper abdominal pain x6 years.  Symptoms occur in flares a few times/year, but increasing in intensity  and frequency since 07/2021.  Described as sharp throbbing type epigastric pain without radiation. - 10/13/2021: Normal CBC (WBC 6.4), normal liver enzymes - 10/23/2021: EGD: Mild non-H. pylori gastritis, otherwise normal - 10/30/2021: HIDA normal - 12/21/2021: ER evaluation for epigastric pain.  Normal CBC, CMP (T. bili 1.3), lipase - 12/21/2021: CT A/P with contrast: Normal to include normal vasculature  Past Medical History:  Diagnosis Date   Allergy    GERD (gastroesophageal reflux disease)    Non-ulcer dyspepsia      No past surgical history on file. Family History  Problem Relation Age of Onset   Colon cancer Neg Hx    Esophageal cancer Neg Hx    Rectal cancer Neg Hx    Stomach cancer Neg Hx    Social History   Tobacco Use   Smoking status: Every Day    Types: E-cigarettes   Smokeless tobacco: Current    Types: Snuff, Chew   Tobacco comments:    Socially smoke  Vaping Use   Vaping status: Every Day  Substance Use Topics   Alcohol use: Yes    Comment: ocassionally   Drug use: No   Current Outpatient Medications  Medication Sig Dispense Refill   acetaminophen (TYLENOL) 500 MG tablet Take 500 mg by mouth as needed. (Patient not taking: Reported on 09/29/2022)     amitriptyline  (ELAVIL ) 50 MG tablet Take 1 tablet (50 mg total) by mouth at bedtime. 90 tablet 0   cetirizine (ZYRTEC) 10 MG tablet Take 20 mg by mouth daily. (Patient not taking:  Reported on 09/29/2022)     diazepam (VALIUM) 2 MG tablet Take 2 mg by mouth as needed. (Patient not taking: Reported on 09/29/2022)     dicyclomine (BENTYL) 20 MG tablet Take 20 mg by mouth as needed. (Patient not taking: Reported on 09/29/2022)     esomeprazole  (NEXIUM ) 20 MG capsule Take 20 mg by mouth every morning. (Patient not taking: Reported on 09/29/2022)     SUMAtriptan  (IMITREX ) 20 MG/ACT nasal spray Place 1 spray (20 mg total) into the nose every 2 (two) hours as needed for migraine or headache. Place 1 spray at onset of  abdominal  pain. If no response, place 2 sprays after 2 hours. (Patient not taking: Reported on 09/29/2022) 1 each 1   traMADol  (ULTRAM ) 50 MG tablet Take 1 tablet (50 mg total) by mouth every 8 (eight) hours as needed. (Patient not taking: Reported on 09/29/2022) 10 tablet 0   No current facility-administered medications for this visit.   No Known Allergies   Review of Systems: All systems reviewed and negative except where noted in HPI.    Physical Exam: BP 110/70   Pulse 84   Ht 6' (1.829 m)   Wt 184 lb (83.5 kg)   BMI 24.95 kg/m   Constitutional: Pleasant,well-developed, male in no acute distress. HEENT: Normocephalic and atraumatic. Conjunctivae are normal. No scleral icterus. Neck supple.  Cardiovascular: Normal rate, regular rhythm.  Pulmonary/chest: Effort normal and breath sounds normal. No wheezing, rales or rhonchi. Abdominal: Soft, nondistended, nontender. Bowel sounds active throughout. There are no masses palpable. No hepatomegaly. Extremities: no edema Lymphadenopathy: No cervical adenopathy noted. Neurological: Alert and oriented to person place and time. Skin: Skin is warm and dry. No rashes noted. Psychiatric: Normal mood and affect. Behavior is normal.  CBC    Component Value Date/Time   WBC 8.7 12/21/2021 1300   RBC 5.45 12/21/2021 1300   HGB 16.2 12/21/2021 1300   HCT 48.1 12/21/2021 1300   PLT 243 12/21/2021 1300   MCV 88.3 12/21/2021 1300   MCH 29.7 12/21/2021 1300   MCHC 33.7 12/21/2021 1300   RDW 13.1 12/21/2021 1300   LYMPHSABS 1.7 12/21/2021 1300   MONOABS 0.5 12/21/2021 1300   EOSABS 0.1 12/21/2021 1300   BASOSABS 0.1 12/21/2021 1300    CMP     Component Value Date/Time   NA 138 12/21/2021 1300   K 4.2 12/21/2021 1300   CL 105 12/21/2021 1300   CO2 24 12/21/2021 1300   GLUCOSE 91 12/21/2021 1300   BUN 14 12/21/2021 1300   CREATININE 1.02 12/21/2021 1300   CALCIUM 9.3 12/21/2021 1300   PROT 7.6 12/21/2021 1300   ALBUMIN 4.7 12/21/2021 1300    AST 19 12/21/2021 1300   ALT 20 12/21/2021 1300   ALKPHOS 51 12/21/2021 1300   BILITOT 1.3 (H) 12/21/2021 1300   GFRNONAA >60 12/21/2021 1300   GFRAA >60 12/14/2015 0300       Latest Ref Rng & Units 12/21/2021    1:00 PM 10/13/2021   10:29 AM 09/28/2021   12:49 PM  CBC EXTENDED  WBC 4.0 - 10.5 K/uL 8.7  6.4  16.6   RBC 4.22 - 5.81 MIL/uL 5.45  5.02  6.01   Hemoglobin 13.0 - 17.0 g/dL 29.5  62.1  30.8   HCT 39.0 - 52.0 % 48.1  44.7  52.8   Platelets 150 - 400 K/uL 243  207.0  263   NEUT# 1.7 - 7.7 K/uL 6.4  4.3  Lymph# 0.7 - 4.0 K/uL 1.7  1.5        ASSESSMENT AND PLAN:  28 year old male with functional abdominal pain which responded well to Elavil , with minimal symptoms over the past year and a half.   Functional abdominal pain Patient has been doing well over the last year on Elavil  50mg  PO daily and would like to discuss tapering down to lowest effective dose, and possibly discontinuing this in the future.  - We will decrease dose of Elavil  to 25mg  PO daily, try this for 6 months and monitor for recurrence of symptoms. If there is recurrence of epigastric pain, can go back up to 50mg .  - Patient instructed to call or send a MyChart message if he does well on 25mg  and would like to drop the dose further to 10mg  and eventually discontinue.   Tobacco use - Counseled patient regarding quitting chewing tobacco.   Arasely Akkerman E. Cherryl Corona, MD [facility]  Patient seen with PA Necia Bali, MD

## 2023-12-21 ENCOUNTER — Other Ambulatory Visit: Payer: Self-pay

## 2023-12-21 ENCOUNTER — Other Ambulatory Visit: Payer: Self-pay | Admitting: Gastroenterology

## 2023-12-21 MED ORDER — AMITRIPTYLINE HCL 25 MG PO TABS: 25.0000 mg | ORAL_TABLET | Freq: Every day | ORAL | 1 refills | Status: AC

## 2024-01-29 IMAGING — CT CT ABD-PELV W/ CM
2 of 4 series · 15 of 46 positions shown, 17 images · IV contrast (Omnipaque)
Comparison: CT examination dated July 26, 2015

CLINICAL DATA: Abdominal pain, acute nonlocalized.  History of GERD

EXAM:
CT ABDOMEN AND PELVIS WITH CONTRAST
TECHNIQUE: Multidetector CT imaging of the abdomen and pelvis was performed
using the standard protocol following bolus administration of
intravenous contrast.

[Series 2: axial st · axial · 0.76mm/px · z∈[-563,-78]mm · 12 of 107 slices shown, 14 images]
[im 5/107  soft-tissue]
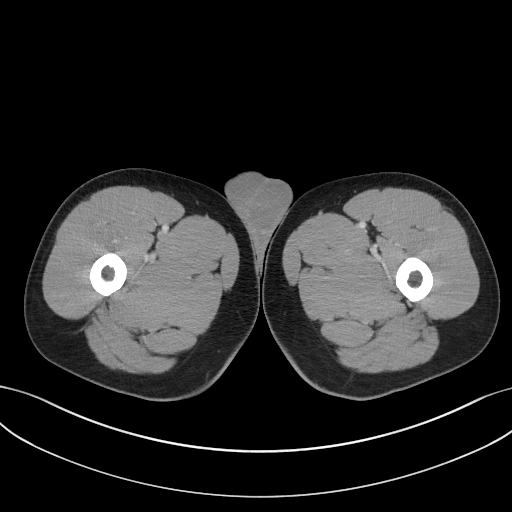
[im 5/107  bone]
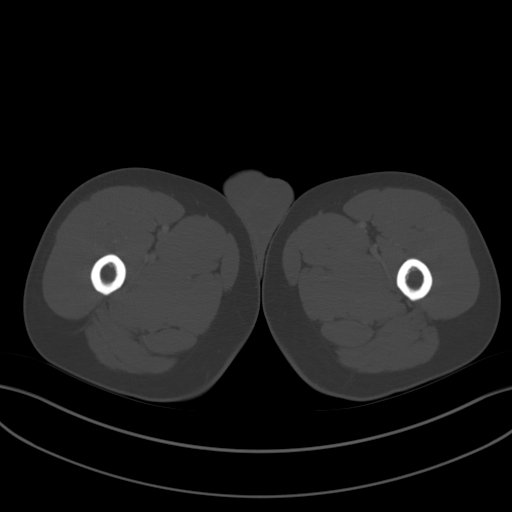
[im 14/107  soft-tissue]
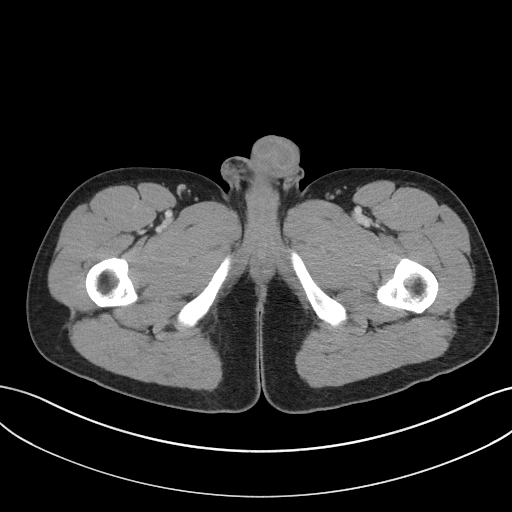
[im 23/107  soft-tissue]
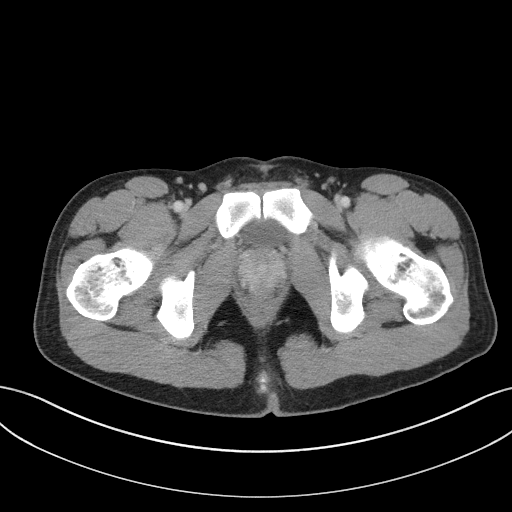
[im 31/107  soft-tissue]
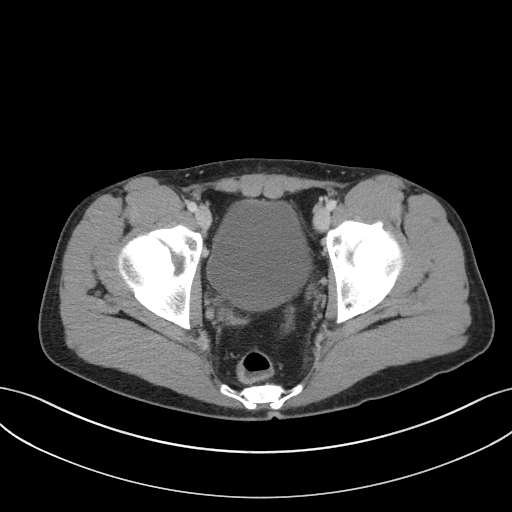
[im 40/107  soft-tissue]
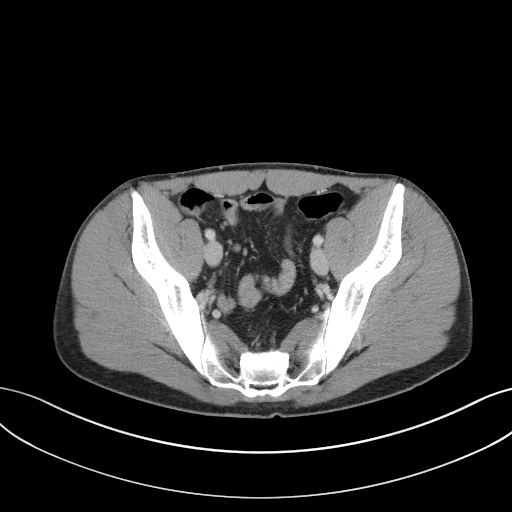
[im 49/107  soft-tissue]
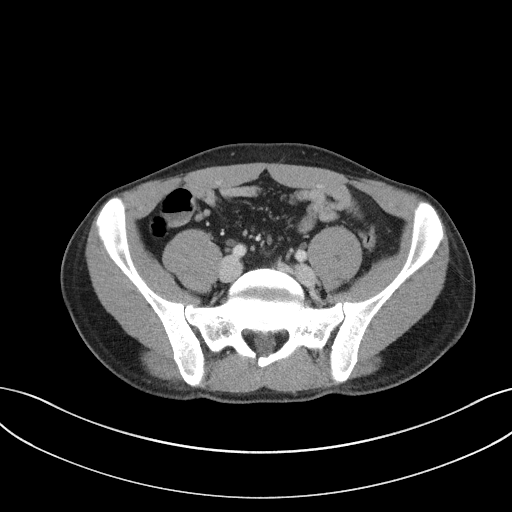
[im 58/107  soft-tissue]
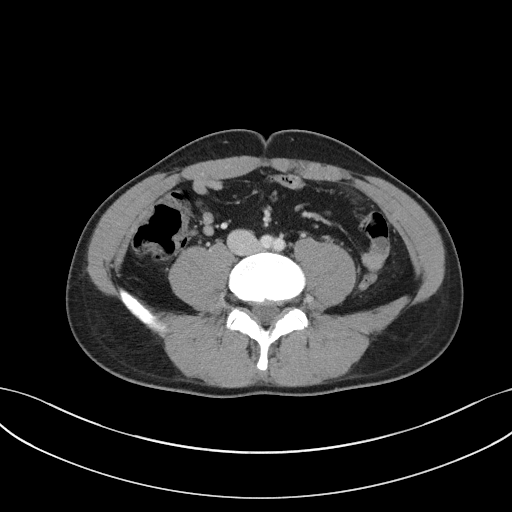
[im 67/107  soft-tissue]
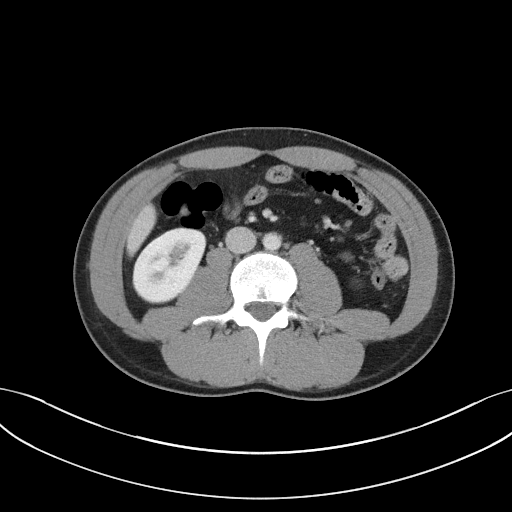
[im 76/107  soft-tissue]
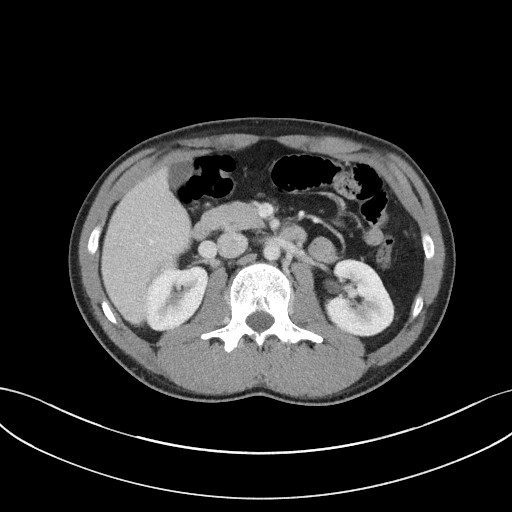
[im 76/107  bone]
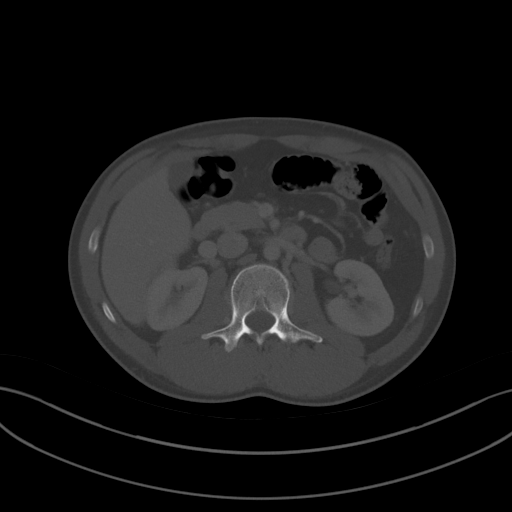
[im 84/107  soft-tissue]
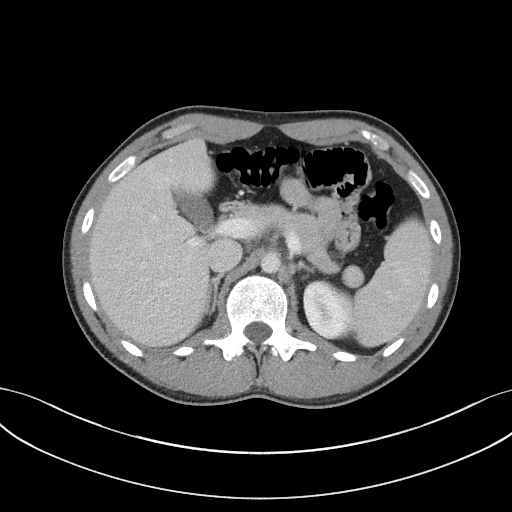
[im 93/107  soft-tissue]
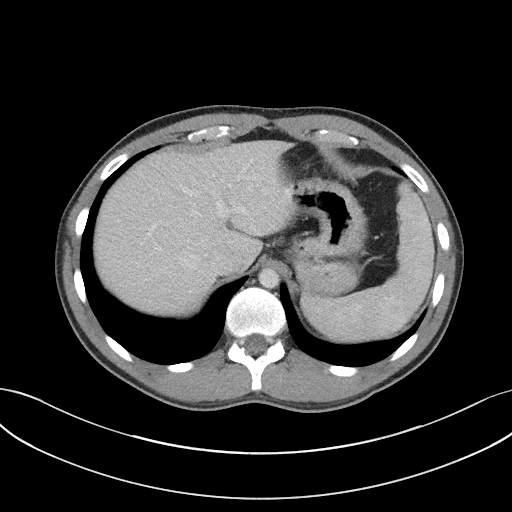
[im 102/107  soft-tissue]
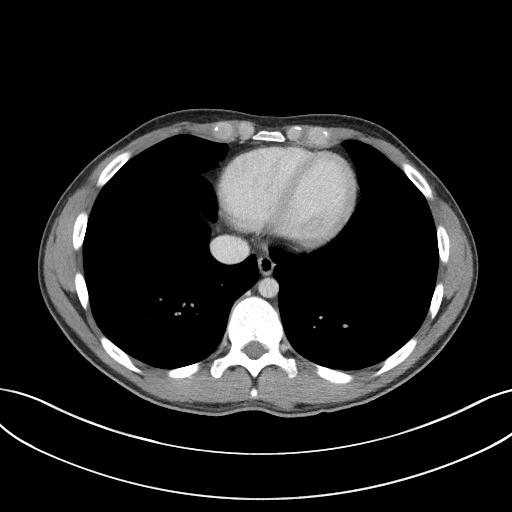

[Series 5: coronal st · coronal · 0.73mm/px · 3 of 82 slices shown]
[im 28/82  soft-tissue]
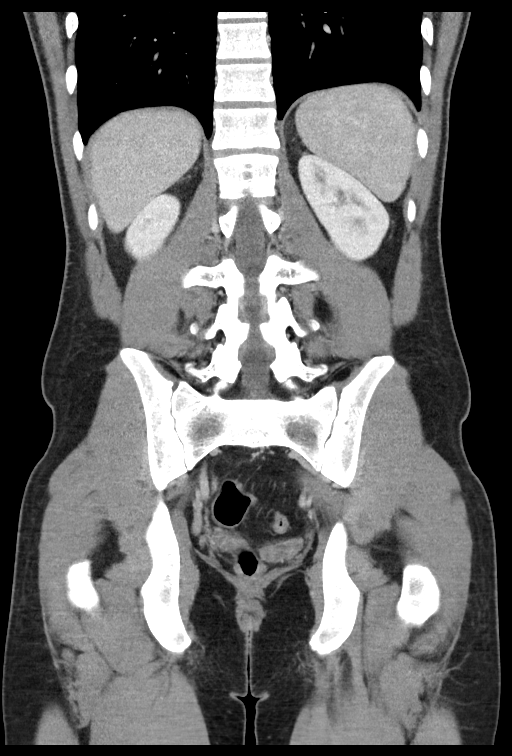
[im 37/82  soft-tissue]
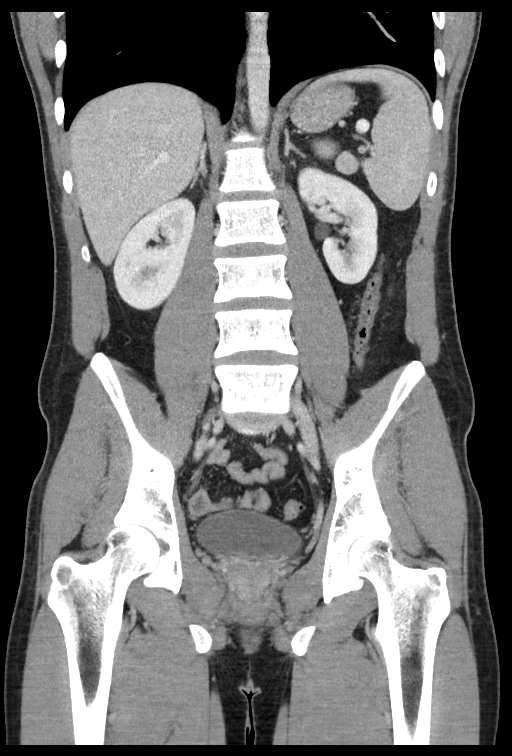
[im 46/82  soft-tissue]
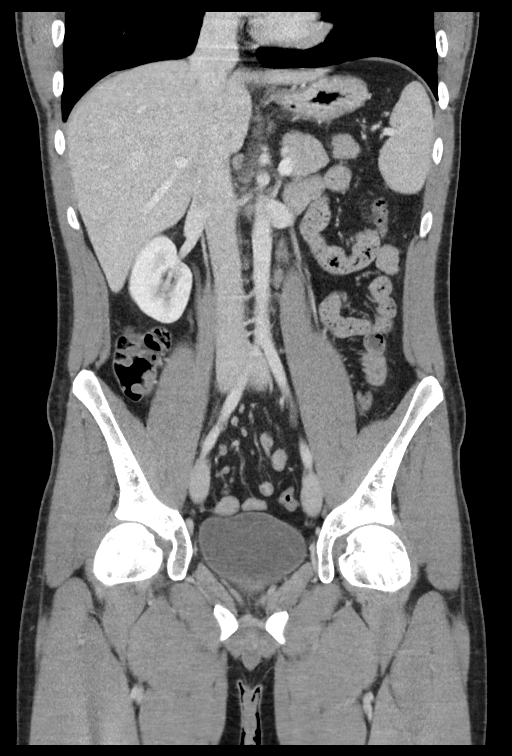

[15 of 46 positions shown; findings below may reference images not displayed]

RADIATION DOSE REDUCTION: This exam was performed according to the
departmental dose-optimization program which includes automated
exposure control, adjustment of the mA and/or kV according to
patient size and/or use of iterative reconstruction technique.

CONTRAST:  100mL OMNIPAQUE IOHEXOL 300 MG/ML  SOLN
FINDINGS: Lower chest: No acute abnormality.

Hepatobiliary: No focal liver abnormality is seen. No gallstones,
gallbladder wall thickening, or biliary dilatation.

Pancreas: Unremarkable. No pancreatic ductal dilatation or
surrounding inflammatory changes.

Spleen: Normal in size without focal abnormality.

Adrenals/Urinary Tract: Adrenal glands are unremarkable. Kidneys are
normal, without renal calculi, focal lesion, or hydronephrosis.
Bladder is unremarkable.

Stomach/Bowel: Stomach is within normal limits. No evidence of
appendicitis. No evidence of bowel wall thickening, distention, or
inflammatory changes.

Vascular/Lymphatic: No significant vascular findings are present. No
enlarged abdominal or pelvic lymph nodes.

Reproductive: Prostate is unremarkable.

Other: No abdominal wall hernia or abnormality. No abdominopelvic
ascites.

Musculoskeletal: No acute or significant osseous findings.
IMPRESSION: 1.  No CT evidence of acute abdominal/pelvic process.

2.  Stomach is unremarkable without evidence of wall thickening.

3.  No evidence of cholelithiasis or acute cholecystitis.

4.  No evidence of nephrolithiasis or hydronephrosis.

## 2024-02-13 ENCOUNTER — Other Ambulatory Visit: Payer: Self-pay | Admitting: Gastroenterology

## 2024-02-13 IMAGING — CT CT CTA ABD/PEL W/CM AND/OR W/O CM
3 of 12 series · 12 of 46 positions shown, 17 images · IV contrast (APPLIED)
Comparison: None Available.

CLINICAL DATA: Epigastric abdominal pain

EXAM:
CTA ABDOMEN AND PELVIS WITHOUT AND WITH CONTRAST
TECHNIQUE: Multidetector CT imaging of the abdomen and pelvis was performed
using the standard protocol during bolus administration of
intravenous contrast. Multiplanar reconstructed images and MIPs were
obtained and reviewed to evaluate the vascular anatomy.

[Series 5: arterial · axial · arterial · 0.86mm/px · z∈[-521,-177]mm · 7 of 246 slices shown]
[im 25/246  soft-tissue]
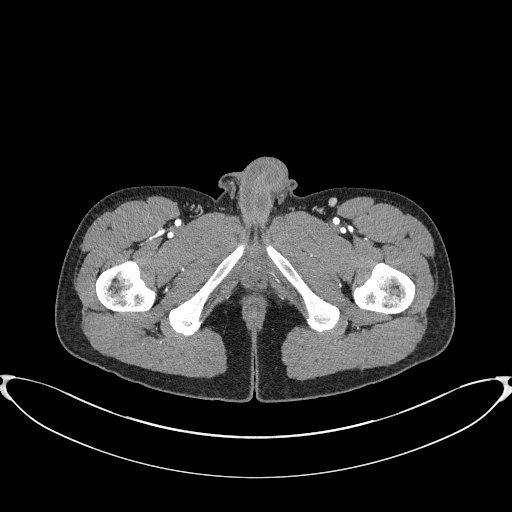
[im 50/246  soft-tissue]
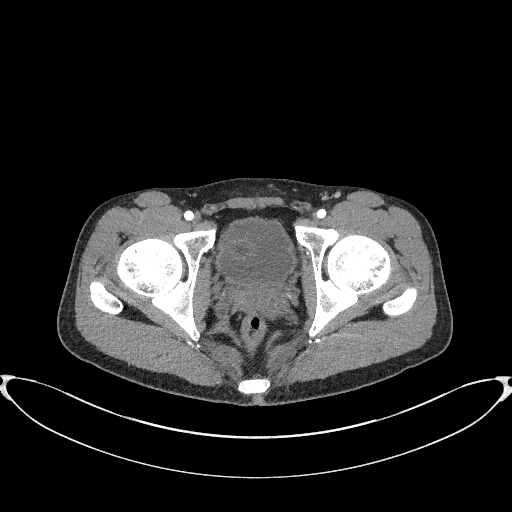
[im 74/246  soft-tissue]
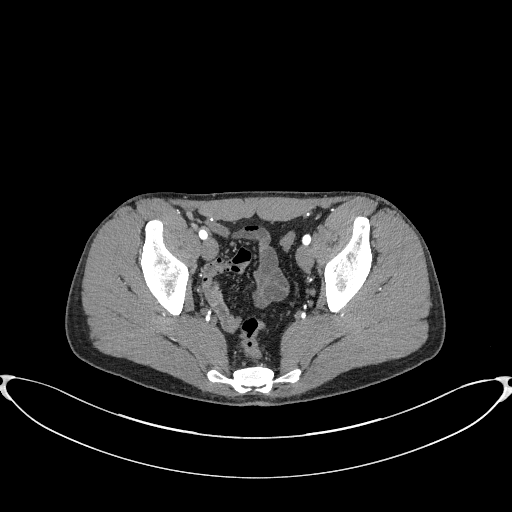
[im 99/246  soft-tissue]
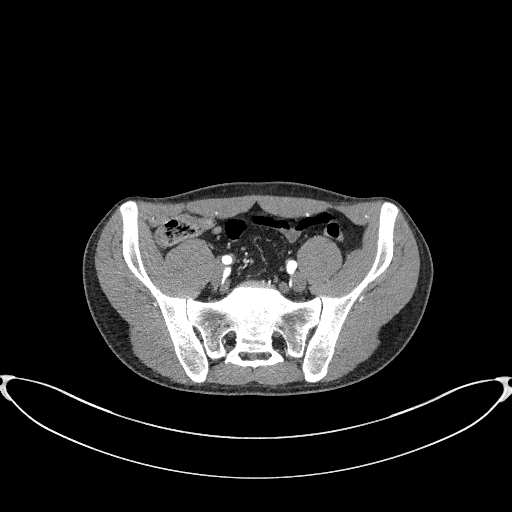
[im 148/246  soft-tissue]
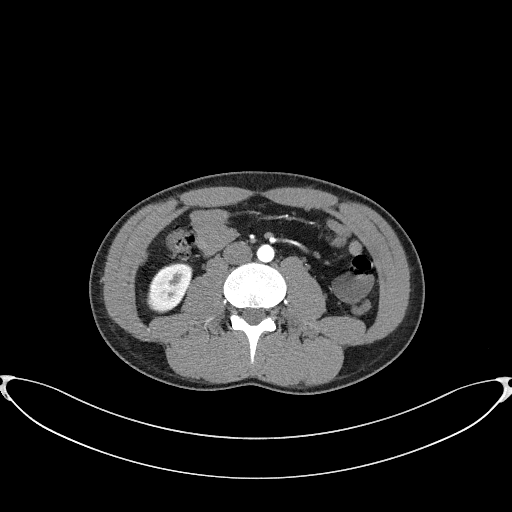
[im 172/246  soft-tissue]
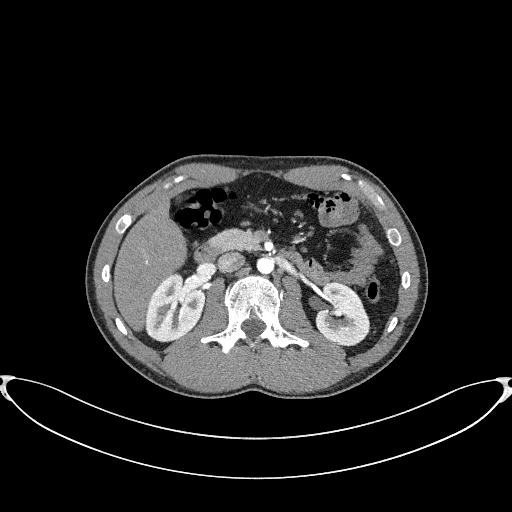
[im 197/246  soft-tissue]
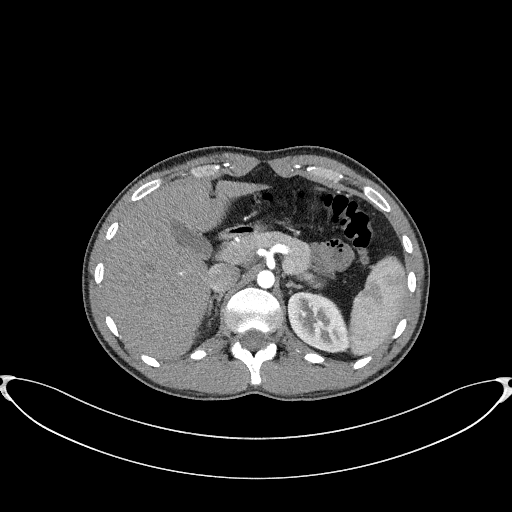

[Series 8: portal venous · axial · portal-venous · 0.86mm/px · z∈[-449,-204]mm · 3 of 99 slices shown, 7 images]
[im 25/99  soft-tissue]
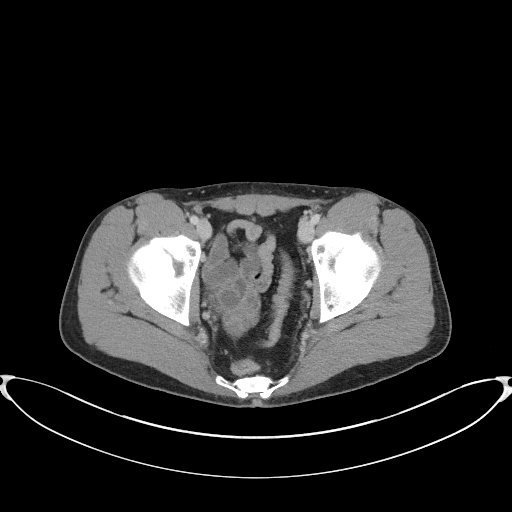
[im 25/99  lung]
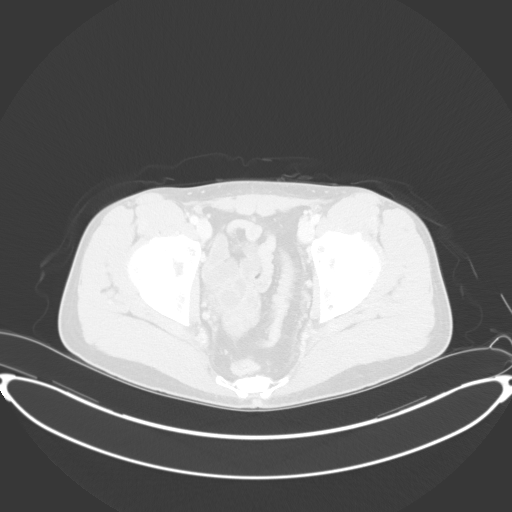
[im 25/99  bone]
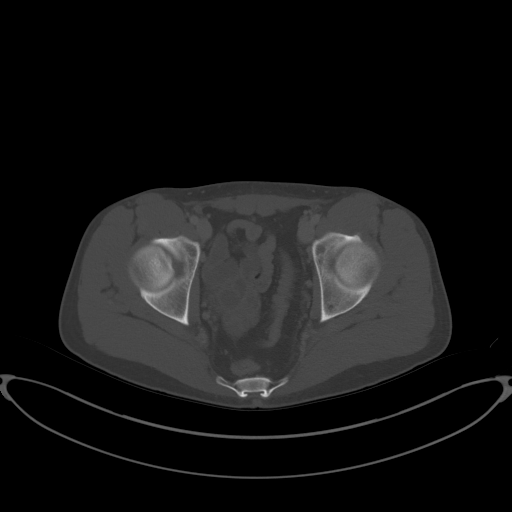
[im 50/99  soft-tissue]
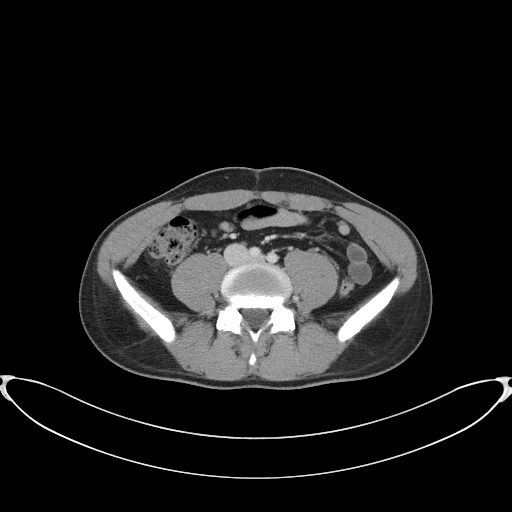
[im 50/99  lung]
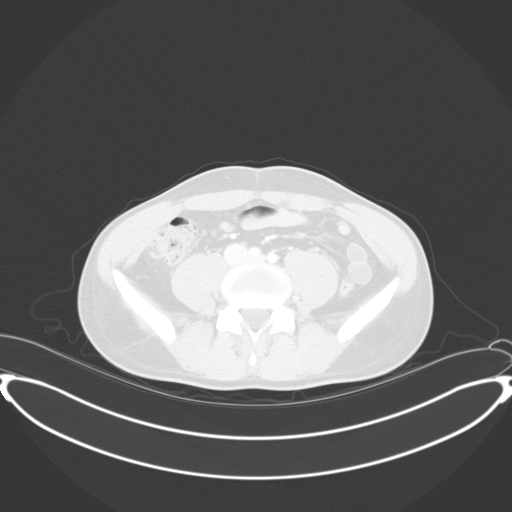
[im 74/99  soft-tissue]
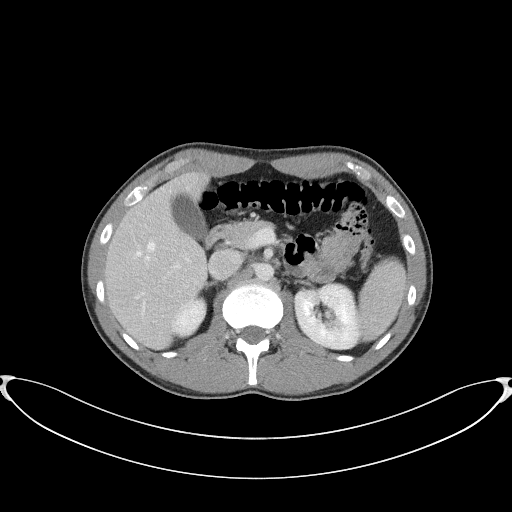
[im 74/99  lung]
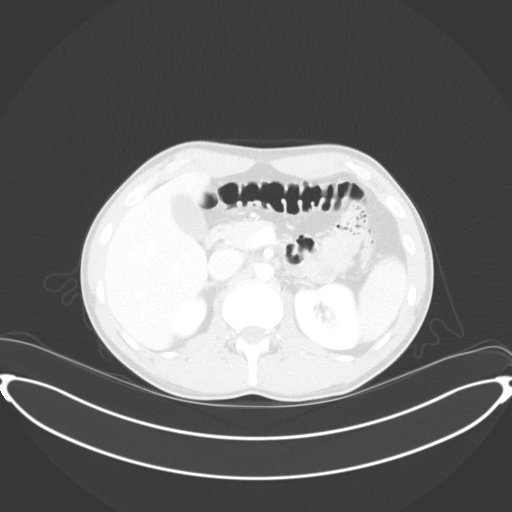

[Series 11: coronal art · coronal · 0.79mm/px · 2 of 151 slices shown, 3 images]
[im 51/151  soft-tissue]
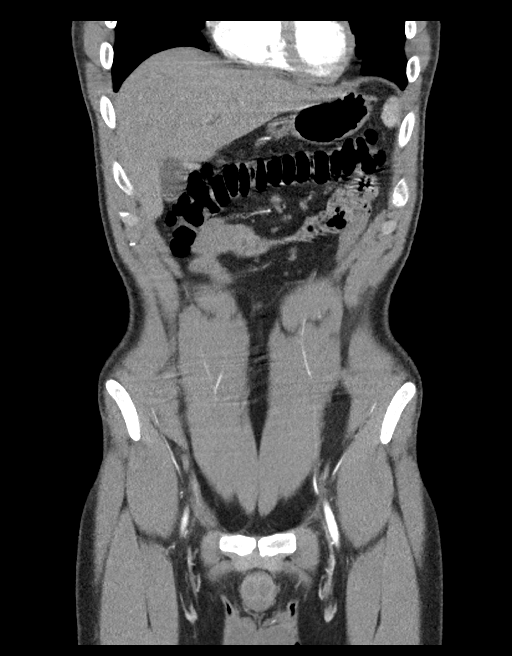
[im 51/151  bone]
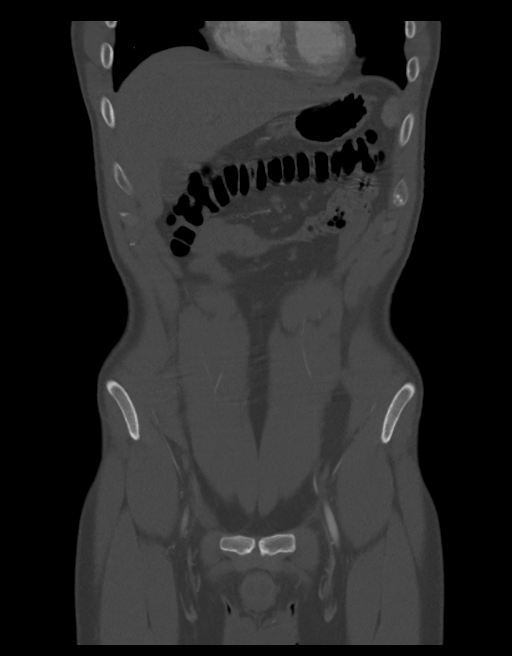
[im 101/151  soft-tissue]
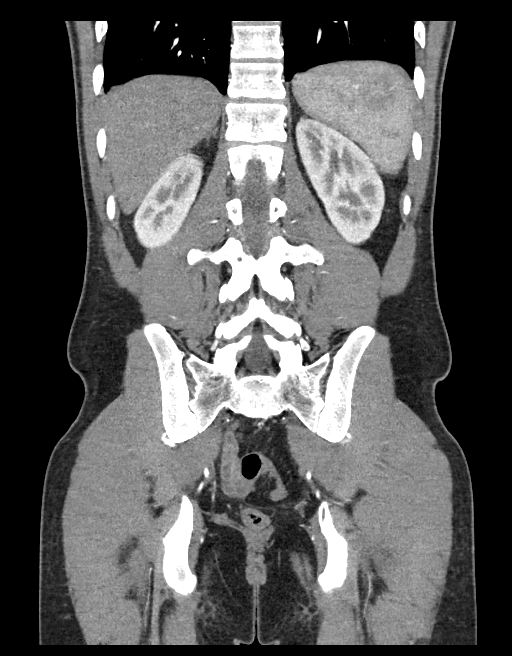

[12 of 46 positions shown; findings below may reference images not displayed]

RADIATION DOSE REDUCTION: This exam was performed according to the
departmental dose-optimization program which includes automated
exposure control, adjustment of the mA and/or kV according to
patient size and/or use of iterative reconstruction technique.

CONTRAST:  100mL OMNIPAQUE IOHEXOL 350 MG/ML SOLN
FINDINGS: VASCULAR

Aorta: Normal caliber aorta without aneurysm, dissection, vasculitis
or significant stenosis.

Celiac: Patent without evidence of aneurysm, dissection, vasculitis
or significant stenosis. The lateral segmental branch of the left
hepatic artery is replaced to the left gastric artery.

SMA: Patent without evidence of aneurysm, dissection, vasculitis or
significant stenosis.

Renals: Renal arteries are patent without evidence of aneurysm,
dissection, vasculitis, fibromuscular dysplasia or significant
stenosis. Solitary right renal artery. Three left-sided renal
arteries.

IMA: Patent without evidence of aneurysm, dissection, vasculitis or
significant stenosis.

Inflow: Patent without evidence of aneurysm, dissection, vasculitis
or significant stenosis.

Proximal Outflow: Bilateral common femoral and visualized portions
of the superficial and profunda femoral arteries are patent without
evidence of aneurysm, dissection, vasculitis or significant
stenosis.

Veins: No focal venous abnormality. Normal appearance of the portal,
splenic and renal veins. Normal IVC and iliac veins. No evidence of
thrombus.

Review of the MIP images confirms the above findings.

NON-VASCULAR

Lower chest: No acute abnormality.

Hepatobiliary: Normal hepatic contour and morphology. Geographic
hypoattenuation in the left hemi-liver adjacent to the fissure for
the falciform ligament is nonspecific but most suggestive of benign
focal fatty infiltration. No discrete hepatic lesion.

Pancreas: Unremarkable. No pancreatic ductal dilatation or
surrounding inflammatory changes.

Spleen: No splenic injury or perisplenic hematoma.

Adrenals/Urinary Tract: Adrenal glands are unremarkable. Kidneys are
normal, without renal calculi, focal lesion, or hydronephrosis.
Bladder is unremarkable.

Stomach/Bowel: Stomach is within normal limits. Appendix appears
normal. No evidence of bowel wall thickening, distention, or
inflammatory changes.

Lymphatic: No suspicious lymphadenopathy.

Reproductive: Prostate is unremarkable.

Other: No abdominal wall hernia or abnormality. No abdominopelvic
ascites.

Musculoskeletal: No acute fracture or aggressive appearing lytic or
blastic osseous lesion.
IMPRESSION: VASCULAR

1. No clinically significant arterial or venous abnormality.
2. Incidental anatomic variations as above.

NON-VASCULAR

1. Geographic hypoattenuation in the left hemi-liver adjacent to the
fissure for the falciform ligament is nonspecific but most
suggestive of benign focal fatty infiltration.
2. Otherwise, unremarkable CT scan of the abdomen and pelvis.

## 2024-02-14 ENCOUNTER — Telehealth: Payer: Self-pay | Admitting: Gastroenterology

## 2024-02-14 NOTE — Telephone Encounter (Signed)
 Patient called and stated that his prescription for amitriptyline  25 MG was denied. Patient is requesting to speak to some one about this. Please advise.

## 2024-02-14 NOTE — Telephone Encounter (Signed)
 Called patient and left message to call back to discuss. Maya send a 90 day supply with 1 refill in May which should take him to November.  Called [facility] pharmacy. They indicate patient can pick up more of the Elavil  25 mg . They will call him to let him know.
